# Patient Record
Sex: Female | Born: 1955 | Race: White | Hispanic: No | Marital: Married | Smoking: Former smoker
Health system: Southern US, Community
[De-identification: ages and names within clinical notes are randomized; demographics above are authoritative.]

## PROBLEM LIST (undated history)

## (undated) HISTORY — PX: CHOLECYSTECTOMY: SHX55

---

## 1998-06-19 ENCOUNTER — Ambulatory Visit: Admission: RE | Admit: 1998-06-19 | Discharge: 1998-06-19 | Payer: Self-pay | Admitting: Obstetrics and Gynecology

## 1998-06-19 ENCOUNTER — Encounter: Payer: Self-pay | Admitting: Family Medicine

## 1998-06-23 HISTORY — PX: BREAST EXCISIONAL BIOPSY: SUR124

## 1999-07-08 ENCOUNTER — Encounter: Admission: RE | Admit: 1999-07-08 | Discharge: 1999-07-08 | Payer: Self-pay | Admitting: Family Medicine

## 1999-07-08 ENCOUNTER — Encounter: Payer: Self-pay | Admitting: Family Medicine

## 1999-07-18 ENCOUNTER — Encounter: Admission: RE | Admit: 1999-07-18 | Discharge: 1999-07-18 | Payer: Self-pay | Admitting: Family Medicine

## 1999-07-18 ENCOUNTER — Encounter: Payer: Self-pay | Admitting: Family Medicine

## 1999-08-01 ENCOUNTER — Ambulatory Visit (HOSPITAL_BASED_OUTPATIENT_CLINIC_OR_DEPARTMENT_OTHER): Admission: RE | Admit: 1999-08-01 | Discharge: 1999-08-01 | Payer: Self-pay | Admitting: Surgery

## 1999-08-01 ENCOUNTER — Encounter (INDEPENDENT_AMBULATORY_CARE_PROVIDER_SITE_OTHER): Payer: Self-pay | Admitting: Specialist

## 1999-08-14 ENCOUNTER — Encounter: Admission: RE | Admit: 1999-08-14 | Discharge: 1999-11-12 | Payer: Self-pay | Admitting: Radiation Oncology

## 2000-07-20 ENCOUNTER — Encounter: Payer: Self-pay | Admitting: Family Medicine

## 2000-07-20 ENCOUNTER — Encounter: Admission: RE | Admit: 2000-07-20 | Discharge: 2000-07-20 | Payer: Self-pay | Admitting: Family Medicine

## 2000-09-10 ENCOUNTER — Ambulatory Visit (HOSPITAL_COMMUNITY): Admission: RE | Admit: 2000-09-10 | Discharge: 2000-09-10 | Payer: Self-pay | Admitting: Hematology & Oncology

## 2000-09-10 ENCOUNTER — Encounter: Payer: Self-pay | Admitting: Hematology & Oncology

## 2000-10-14 ENCOUNTER — Encounter: Payer: Self-pay | Admitting: Hematology & Oncology

## 2000-10-14 ENCOUNTER — Ambulatory Visit (HOSPITAL_COMMUNITY): Admission: RE | Admit: 2000-10-14 | Discharge: 2000-10-14 | Payer: Self-pay | Admitting: Hematology & Oncology

## 2001-06-30 ENCOUNTER — Encounter: Payer: Self-pay | Admitting: Family Medicine

## 2001-06-30 ENCOUNTER — Encounter: Admission: RE | Admit: 2001-06-30 | Discharge: 2001-06-30 | Payer: Self-pay | Admitting: Family Medicine

## 2001-07-22 ENCOUNTER — Encounter: Payer: Self-pay | Admitting: Family Medicine

## 2001-07-22 ENCOUNTER — Encounter: Admission: RE | Admit: 2001-07-22 | Discharge: 2001-07-22 | Payer: Self-pay | Admitting: Family Medicine

## 2002-07-27 ENCOUNTER — Encounter: Admission: RE | Admit: 2002-07-27 | Discharge: 2002-07-27 | Payer: Self-pay | Admitting: Family Medicine

## 2002-07-27 ENCOUNTER — Encounter: Payer: Self-pay | Admitting: Family Medicine

## 2003-08-09 ENCOUNTER — Encounter: Admission: RE | Admit: 2003-08-09 | Discharge: 2003-08-09 | Payer: Self-pay | Admitting: Family Medicine

## 2004-05-22 ENCOUNTER — Ambulatory Visit: Payer: Self-pay | Admitting: Internal Medicine

## 2004-05-22 ENCOUNTER — Ambulatory Visit: Payer: Self-pay

## 2004-05-31 ENCOUNTER — Ambulatory Visit: Payer: Self-pay | Admitting: Internal Medicine

## 2004-07-24 ENCOUNTER — Ambulatory Visit: Payer: Self-pay | Admitting: Hematology & Oncology

## 2004-08-14 ENCOUNTER — Encounter: Admission: RE | Admit: 2004-08-14 | Discharge: 2004-08-14 | Payer: Self-pay | Admitting: Hematology & Oncology

## 2005-06-04 ENCOUNTER — Other Ambulatory Visit: Admission: RE | Admit: 2005-06-04 | Discharge: 2005-06-04 | Payer: Self-pay | Admitting: Family Medicine

## 2005-06-11 ENCOUNTER — Ambulatory Visit: Payer: Self-pay | Admitting: Internal Medicine

## 2005-07-23 ENCOUNTER — Ambulatory Visit: Payer: Self-pay | Admitting: Hematology & Oncology

## 2005-08-15 ENCOUNTER — Encounter: Admission: RE | Admit: 2005-08-15 | Discharge: 2005-08-15 | Payer: Self-pay | Admitting: Hematology & Oncology

## 2006-08-11 ENCOUNTER — Ambulatory Visit: Payer: Self-pay | Admitting: Hematology & Oncology

## 2006-08-14 LAB — CBC WITH DIFFERENTIAL/PLATELET
EOS%: 1.7 % (ref 0.0–7.0)
LYMPH%: 33.6 % (ref 14.0–48.0)
MCH: 28.2 pg (ref 26.0–34.0)
MCHC: 34.3 g/dL (ref 32.0–36.0)
NEUT#: 3.8 10*3/uL (ref 1.5–6.5)
Platelets: 305 10*3/uL (ref 145–400)
RDW: 11.7 % (ref 11.3–14.5)
lymph#: 2.3 10*3/uL (ref 0.9–3.3)

## 2006-08-14 LAB — COMPREHENSIVE METABOLIC PANEL
ALT: 23 U/L (ref 0–35)
AST: 21 U/L (ref 0–37)
Alkaline Phosphatase: 32 U/L — ABNORMAL LOW (ref 39–117)
BUN: 16 mg/dL (ref 6–23)
Calcium: 9.3 mg/dL (ref 8.4–10.5)
Creatinine, Ser: 0.77 mg/dL (ref 0.40–1.20)
Total Bilirubin: 0.6 mg/dL (ref 0.3–1.2)
Total Protein: 6.7 g/dL (ref 6.0–8.3)

## 2006-08-18 ENCOUNTER — Encounter: Admission: RE | Admit: 2006-08-18 | Discharge: 2006-08-18 | Payer: Self-pay | Admitting: Hematology & Oncology

## 2007-08-24 ENCOUNTER — Encounter: Admission: RE | Admit: 2007-08-24 | Discharge: 2007-08-24 | Payer: Self-pay | Admitting: Hematology & Oncology

## 2007-11-18 ENCOUNTER — Other Ambulatory Visit: Admission: RE | Admit: 2007-11-18 | Discharge: 2007-11-18 | Payer: Self-pay | Admitting: Family Medicine

## 2008-06-20 ENCOUNTER — Ambulatory Visit: Payer: Self-pay | Admitting: Hematology & Oncology

## 2008-06-26 ENCOUNTER — Encounter: Admission: RE | Admit: 2008-06-26 | Discharge: 2008-06-26 | Payer: Self-pay | Admitting: Hematology & Oncology

## 2008-06-30 ENCOUNTER — Encounter: Admission: RE | Admit: 2008-06-30 | Discharge: 2008-06-30 | Payer: Self-pay | Admitting: Hematology & Oncology

## 2009-06-19 ENCOUNTER — Ambulatory Visit: Payer: Self-pay | Admitting: Hematology & Oncology

## 2009-06-20 LAB — COMPREHENSIVE METABOLIC PANEL
AST: 20 U/L (ref 0–37)
Alkaline Phosphatase: 46 U/L (ref 39–117)
BUN: 16 mg/dL (ref 6–23)
CO2: 27 mEq/L (ref 19–32)
Calcium: 8.9 mg/dL (ref 8.4–10.5)
Creatinine, Ser: 0.75 mg/dL (ref 0.40–1.20)
Glucose, Bld: 108 mg/dL — ABNORMAL HIGH (ref 70–99)
Potassium: 3.9 mEq/L (ref 3.5–5.3)
Sodium: 140 mEq/L (ref 135–145)
Total Bilirubin: 0.4 mg/dL (ref 0.3–1.2)

## 2009-06-20 LAB — CBC WITH DIFFERENTIAL (CANCER CENTER ONLY)
BASO%: 0.4 % (ref 0.0–2.0)
EOS%: 2.2 % (ref 0.0–7.0)
Eosinophils Absolute: 0.1 10*3/uL (ref 0.0–0.5)
LYMPH%: 33.1 % (ref 14.0–48.0)
MCH: 30.3 pg (ref 26.0–34.0)
MCHC: 35 g/dL (ref 32.0–36.0)
MCV: 87 fL (ref 81–101)
NEUT#: 3.1 10*3/uL (ref 1.5–6.5)
NEUT%: 58.5 % (ref 39.6–80.0)

## 2009-07-06 ENCOUNTER — Encounter: Admission: RE | Admit: 2009-07-06 | Discharge: 2009-07-06 | Payer: Self-pay | Admitting: Hematology & Oncology

## 2010-06-25 ENCOUNTER — Ambulatory Visit: Payer: Self-pay | Admitting: Hematology & Oncology

## 2010-06-26 LAB — COMPREHENSIVE METABOLIC PANEL
ALT: 23 U/L (ref 0–35)
AST: 22 U/L (ref 0–37)
Albumin: 3.8 g/dL (ref 3.5–5.2)
Alkaline Phosphatase: 42 U/L (ref 39–117)
BUN: 15 mg/dL (ref 6–23)
CO2: 28 mEq/L (ref 19–32)
Calcium: 9 mg/dL (ref 8.4–10.5)
Chloride: 100 mEq/L (ref 96–112)
Creatinine, Ser: 0.71 mg/dL (ref 0.40–1.20)
Glucose, Bld: 88 mg/dL (ref 70–99)
Potassium: 4 mEq/L (ref 3.5–5.3)
Sodium: 137 mEq/L (ref 135–145)
Total Bilirubin: 0.6 mg/dL (ref 0.3–1.2)
Total Protein: 6.6 g/dL (ref 6.0–8.3)

## 2010-06-26 LAB — CBC WITH DIFFERENTIAL (CANCER CENTER ONLY)
BASO#: 0 10*3/uL (ref 0.0–0.2)
BASO%: 0.4 % (ref 0.0–2.0)
EOS%: 2.1 % (ref 0.0–7.0)
Eosinophils Absolute: 0.1 10*3/uL (ref 0.0–0.5)
HCT: 39.2 % (ref 34.8–46.6)
HGB: 13.3 g/dL (ref 11.6–15.9)
LYMPH#: 1.7 10*3/uL (ref 0.9–3.3)
LYMPH%: 30.5 % (ref 14.0–48.0)
MCH: 30.5 pg (ref 26.0–34.0)
MCHC: 33.9 g/dL (ref 32.0–36.0)
MCV: 90 fL (ref 81–101)
MONO#: 0.4 10*3/uL (ref 0.1–0.9)
MONO%: 7.3 % (ref 0.0–13.0)
NEUT#: 3.4 10*3/uL (ref 1.5–6.5)
NEUT%: 59.7 % (ref 39.6–80.0)
Platelets: 263 10*3/uL (ref 145–400)
RBC: 4.36 10*6/uL (ref 3.70–5.32)
RDW: 11.2 % (ref 10.5–14.6)
WBC: 5.6 10*3/uL (ref 3.9–10.0)

## 2010-07-11 ENCOUNTER — Other Ambulatory Visit: Payer: Self-pay | Admitting: Hematology & Oncology

## 2010-07-11 DIAGNOSIS — Z0000 Encounter for general adult medical examination without abnormal findings: Secondary | ICD-10-CM

## 2010-07-29 ENCOUNTER — Other Ambulatory Visit: Payer: Self-pay | Admitting: Family Medicine

## 2010-07-29 ENCOUNTER — Other Ambulatory Visit (HOSPITAL_COMMUNITY)
Admission: RE | Admit: 2010-07-29 | Discharge: 2010-07-29 | Disposition: A | Discharge status: Home / Self Care | Payer: BC Managed Care – PPO | Source: Ambulatory Visit | Attending: Family Medicine | Admitting: Family Medicine

## 2010-07-29 DIAGNOSIS — Z124 Encounter for screening for malignant neoplasm of cervix: Secondary | ICD-10-CM | POA: Insufficient documentation

## 2010-07-31 ENCOUNTER — Ambulatory Visit (HOSPITAL_BASED_OUTPATIENT_CLINIC_OR_DEPARTMENT_OTHER): Payer: Self-pay

## 2010-07-31 ENCOUNTER — Ambulatory Visit (HOSPITAL_BASED_OUTPATIENT_CLINIC_OR_DEPARTMENT_OTHER)
Admission: RE | Admit: 2010-07-31 | Discharge: 2010-07-31 | Disposition: A | Discharge status: Home / Self Care | Payer: BC Managed Care – PPO | Source: Ambulatory Visit | Attending: Hematology & Oncology | Admitting: Hematology & Oncology

## 2010-07-31 DIAGNOSIS — Z0000 Encounter for general adult medical examination without abnormal findings: Secondary | ICD-10-CM

## 2010-07-31 DIAGNOSIS — Z1231 Encounter for screening mammogram for malignant neoplasm of breast: Secondary | ICD-10-CM | POA: Insufficient documentation

## 2011-05-26 ENCOUNTER — Telehealth: Payer: Self-pay | Admitting: *Deleted

## 2011-05-26 NOTE — Telephone Encounter (Signed)
Left message with husband changed time of 1-4 appointment

## 2011-06-25 ENCOUNTER — Other Ambulatory Visit: Payer: Self-pay | Admitting: Hematology & Oncology

## 2011-06-25 DIAGNOSIS — Z1231 Encounter for screening mammogram for malignant neoplasm of breast: Secondary | ICD-10-CM

## 2011-06-27 ENCOUNTER — Ambulatory Visit (HOSPITAL_BASED_OUTPATIENT_CLINIC_OR_DEPARTMENT_OTHER): Payer: BC Managed Care – PPO | Admitting: Hematology & Oncology

## 2011-06-27 VITALS — BP 124/82 | HR 83 | Temp 96.3°F | Ht 63.5 in | Wt 149.0 lb

## 2011-06-27 DIAGNOSIS — D0510 Intraductal carcinoma in situ of unspecified breast: Secondary | ICD-10-CM

## 2011-06-27 DIAGNOSIS — Z853 Personal history of malignant neoplasm of breast: Secondary | ICD-10-CM

## 2011-06-27 NOTE — Progress Notes (Signed)
This office note has been dictated.

## 2011-06-30 NOTE — Progress Notes (Signed)
CC:   Magnus Sinning) Tenny Craw, M.D.  DIAGNOSIS:  Ductal carcinoma in situ of the left breast.  CURRENT THERAPY:  Observation.  INTERIM HISTORY:  Ms. Uphoff comes in for followup.  We see her once a year.  She is doing well.  She has had a good year last year.  She went to [place] with her husband on a business trip.  She really had a good time over there.  She has 2 grandkids now.  She really enjoys them.  She is really big into animals and is really enjoying the dogs that she has.  She has not had any problems with her health.  There is no problem with hot flashes or sweats.  She has had no fevers, sweats or chills.  There has been no problem with bowels or bladder.  Her last mammogram was actually January of last year.  Everything looked fine with the mammogram.  She is set up for one this month.  She has had no rashes.  She has had no cough or shortness breath.  PHYSICAL EXAMINATION:  This is a well-developed, well-nourished white female in no obvious distress.  Vital signs:  temperature 96.3, pulse 83, respiratory rate 18, blood pressure 124/82.  Weight is 149.  Head and neck:  Shows a normocephalic, atraumatic skull.  There are no ocular or oral lesions.  There are no palpable cervical or supraclavicular lymph nodes.  Lungs:  clear bilaterally.  Cardiac:  regular rate and rhythm with a normal S1 and S2.  There are no murmurs, rubs or bruits. abdomen:  Soft with good bowel sounds.  There is no palpable abdominal mass.  There is no fluid wave.  No palpable hepatosplenomegaly. Breasts:  right breast with no masses, edema or erythema.  There is no right axillary adenopathy.  Left breast shows well-healed lumpectomy at the 9 o'clock position.  There is some slight contraction of the left breast.  There is some slight left nipple eversion.  She has no left axillary adenopathy.  Back:  No tenderness over the spine, ribs, or hips.  There is no osteoporosis kyphosis.  Extremities:  no  clubbing, cyanosis or edema.  Skin:  no rashes, ecchymosis or petechia.  LABORATORY STUDIES:  Not done this visit.  IMPRESSION:  Ms. Sponsel left is a 56 year old white female with a history ductal carcinoma situ of the left breast.  this was back in 2001.  She is doing very well.  I do not see any issues with respect to recurrence.  we will plan to see her in 1 more year.  I did tell her to take 1 baby aspirin a day.  I really believe this is going to be helpful with respect to cardiovascular maintenance and also to some degree, prevention of cancer.    ______________________________ Josph Macho, M.D. PRE/MEDQ  D:  06/27/2011  T:  06/27/2011  Job:  [id number]

## 2011-08-04 ENCOUNTER — Ambulatory Visit (HOSPITAL_BASED_OUTPATIENT_CLINIC_OR_DEPARTMENT_OTHER)
Admission: RE | Admit: 2011-08-04 | Discharge: 2011-08-04 | Disposition: A | Discharge status: Home / Self Care | Payer: BC Managed Care – PPO | Source: Ambulatory Visit | Attending: Hematology & Oncology | Admitting: Hematology & Oncology

## 2011-08-04 DIAGNOSIS — Z1231 Encounter for screening mammogram for malignant neoplasm of breast: Secondary | ICD-10-CM | POA: Insufficient documentation

## 2012-06-25 ENCOUNTER — Other Ambulatory Visit (HOSPITAL_BASED_OUTPATIENT_CLINIC_OR_DEPARTMENT_OTHER): Payer: BC Managed Care – PPO | Admitting: Lab

## 2012-06-25 ENCOUNTER — Ambulatory Visit (HOSPITAL_BASED_OUTPATIENT_CLINIC_OR_DEPARTMENT_OTHER): Payer: BC Managed Care – PPO | Admitting: Hematology & Oncology

## 2012-06-25 VITALS — BP 118/77 | HR 74 | Temp 97.8°F | Resp 16 | Ht 63.0 in | Wt 153.0 lb

## 2012-06-25 DIAGNOSIS — D0510 Intraductal carcinoma in situ of unspecified breast: Secondary | ICD-10-CM

## 2012-06-25 DIAGNOSIS — Z87898 Personal history of other specified conditions: Secondary | ICD-10-CM

## 2012-06-25 DIAGNOSIS — M810 Age-related osteoporosis without current pathological fracture: Secondary | ICD-10-CM

## 2012-06-25 LAB — CBC WITH DIFFERENTIAL (CANCER CENTER ONLY)
BASO%: 0.3 % (ref 0.0–2.0)
EOS%: 2 % (ref 0.0–7.0)
HCT: 37.6 % (ref 34.8–46.6)
LYMPH#: 1.7 10*3/uL (ref 0.9–3.3)
LYMPH%: 28.8 % (ref 14.0–48.0)
MCHC: 33.8 g/dL (ref 32.0–36.0)
MONO%: 8.6 % (ref 0.0–13.0)
RBC: 4.26 10*6/uL (ref 3.70–5.32)
WBC: 6 10*3/uL (ref 3.9–10.0)

## 2012-06-25 NOTE — Progress Notes (Signed)
This office note has been dictated.

## 2012-06-26 LAB — COMPREHENSIVE METABOLIC PANEL
AST: 26 U/L (ref 0–37)
Albumin: 3.5 g/dL (ref 3.5–5.2)
Alkaline Phosphatase: 42 U/L (ref 39–117)
BUN: 12 mg/dL (ref 6–23)
Calcium: 9.2 mg/dL (ref 8.4–10.5)
Chloride: 102 mEq/L (ref 96–112)
Sodium: 137 mEq/L (ref 135–145)
Total Protein: 6.4 g/dL (ref 6.0–8.3)

## 2012-06-26 NOTE — Progress Notes (Signed)
CC:   Magnus Sinning) Tenny Craw, M.D.  DIAGNOSIS:  Ductal carcinoma in situ of the left breast.  CURRENT THERAPY:  Observation.  INTERVAL HISTORY:  Ms. Spielberg comes in for her yearly followup.  She had a good time last year.  She really had not happen health-wise to her.  Her family has been doing well.  They took a family trip down to [place] to [place] in October.  She had a good time down there.  She has had no problems with bony pain.  She is on vitamin D.  There has been no change in bowel or bladder habits.  She has had no fevers, sweats or chills.  She has had no headache.  There have been no swallowing difficulties.  Her last mammogram was done back in February of 2013.  The mammogram looked fine with no evidence of malignancy.  PHYSICAL EXAMINATION:  This is a well-developed, well-nourished white female in no obvious distress.  Vital signs:  Temperature of 97.8. Pulse is 74, respiratory rate 16, blood pressure 118/77.  Weight is 153. Head/neck:  Normocephalic, atraumatic skull.  There are no ocular or oral lesions.  There are no palpable cervical or supraclavicular lymph nodes.  Lungs:  Clear bilaterally.  Cardiac:  Regular rate and rhythm with a normal S1, S2.  There are no murmurs, rubs or bruits.  Breasts: Right breast:  No masses, edema or erythema.  There is no right axillary adenopathy.  Left breast shows a well-healed lumpectomy at the 9 o'clock position.  There was no distinct mass in the left breast.  There was no left axillary adenopathy.  Abdomen:  Soft with good bowel sounds.  There is no palpable abdominal mass.  There is no fluid wave.  There is no palpable hepatosplenomegaly.  Back:  No tenderness over the spine, ribs, or hips.  There is no kyphosis or osteoporotic changes.  Extremities: No clubbing, cyanosis or edema.  LABORATORY STUDIES:  White cell count is 6, hemoglobin 12.7, hematocrit 37.6, platelet count 245.  IMPRESSION:  Ms. Ohmer is a  57 year old white female with a history of ductal carcinoma in situ of the left breast.  She was diagnosed back in 2001.  Everything has been doing well with her.  She has had no evidence of recurrence.  We will go ahead and plan to continue to see her back yearly.    ______________________________ Josph Macho, M.D. PRE/MEDQ  D:  06/25/2012  T:  06/26/2012  Job:  [id number]

## 2012-06-28 ENCOUNTER — Telehealth: Payer: Self-pay | Admitting: *Deleted

## 2012-06-28 NOTE — Telephone Encounter (Signed)
Called patient to let her know that her labs and vitamin d levels are ok per dr. Myna Hidalgo

## 2012-06-28 NOTE — Telephone Encounter (Signed)
Message copied by Anselm Jungling on Mon Jun 28, 2012 10:29 AM ------      Message from: Josph Macho      Created: Sat Jun 26, 2012 12:09 PM       Call -labs and vit D are great!!!Cindee Lame

## 2012-07-05 ENCOUNTER — Other Ambulatory Visit: Payer: Self-pay | Admitting: Hematology & Oncology

## 2012-07-05 DIAGNOSIS — Z1239 Encounter for other screening for malignant neoplasm of breast: Secondary | ICD-10-CM

## 2012-08-04 ENCOUNTER — Ambulatory Visit (HOSPITAL_BASED_OUTPATIENT_CLINIC_OR_DEPARTMENT_OTHER)
Admission: RE | Admit: 2012-08-04 | Discharge: 2012-08-04 | Disposition: A | Discharge status: Home / Self Care | Payer: BC Managed Care – PPO | Source: Ambulatory Visit | Attending: Hematology & Oncology | Admitting: Hematology & Oncology

## 2012-08-04 DIAGNOSIS — Z1231 Encounter for screening mammogram for malignant neoplasm of breast: Secondary | ICD-10-CM | POA: Insufficient documentation

## 2012-08-04 DIAGNOSIS — Z1239 Encounter for other screening for malignant neoplasm of breast: Secondary | ICD-10-CM

## 2013-06-30 ENCOUNTER — Other Ambulatory Visit: Payer: Self-pay | Admitting: Hematology & Oncology

## 2013-06-30 DIAGNOSIS — Z1231 Encounter for screening mammogram for malignant neoplasm of breast: Secondary | ICD-10-CM

## 2013-07-07 ENCOUNTER — Other Ambulatory Visit: Payer: Self-pay | Admitting: Nurse Practitioner

## 2013-07-07 DIAGNOSIS — D0512 Intraductal carcinoma in situ of left breast: Secondary | ICD-10-CM | POA: Insufficient documentation

## 2013-07-08 ENCOUNTER — Ambulatory Visit (HOSPITAL_BASED_OUTPATIENT_CLINIC_OR_DEPARTMENT_OTHER): Payer: BC Managed Care – PPO | Admitting: Hematology & Oncology

## 2013-07-08 ENCOUNTER — Other Ambulatory Visit (HOSPITAL_BASED_OUTPATIENT_CLINIC_OR_DEPARTMENT_OTHER): Payer: BC Managed Care – PPO | Admitting: Lab

## 2013-07-08 ENCOUNTER — Encounter: Payer: Self-pay | Admitting: Hematology & Oncology

## 2013-07-08 VITALS — BP 112/66 | HR 90 | Temp 98.0°F | Resp 14 | Ht 64.0 in | Wt 158.0 lb

## 2013-07-08 DIAGNOSIS — D0512 Intraductal carcinoma in situ of left breast: Secondary | ICD-10-CM

## 2013-07-08 DIAGNOSIS — M818 Other osteoporosis without current pathological fracture: Secondary | ICD-10-CM

## 2013-07-08 DIAGNOSIS — Z853 Personal history of malignant neoplasm of breast: Secondary | ICD-10-CM

## 2013-07-08 DIAGNOSIS — T386X5A Adverse effect of antigonadotrophins, antiestrogens, antiandrogens, not elsewhere classified, initial encounter: Secondary | ICD-10-CM

## 2013-07-08 DIAGNOSIS — D0510 Intraductal carcinoma in situ of unspecified breast: Secondary | ICD-10-CM

## 2013-07-08 LAB — CBC WITH DIFFERENTIAL (CANCER CENTER ONLY)
BASO#: 0 10*3/uL (ref 0.0–0.2)
BASO%: 0.3 % (ref 0.0–2.0)
EOS ABS: 0.1 10*3/uL (ref 0.0–0.5)
EOS%: 1.9 % (ref 0.0–7.0)
HCT: 37.9 % (ref 34.8–46.6)
HEMOGLOBIN: 12.7 g/dL (ref 11.6–15.9)
LYMPH#: 1.8 10*3/uL (ref 0.9–3.3)
LYMPH%: 25 % (ref 14.0–48.0)
MCH: 29.3 pg (ref 26.0–34.0)
MCHC: 33.5 g/dL (ref 32.0–36.0)
MCV: 87 fL (ref 81–101)
MONO#: 0.6 10*3/uL (ref 0.1–0.9)
MONO%: 7.8 % (ref 0.0–13.0)
NEUT%: 65 % (ref 39.6–80.0)
NEUTROS ABS: 4.7 10*3/uL (ref 1.5–6.5)
Platelets: 264 10*3/uL (ref 145–400)
RBC: 4.34 10*6/uL (ref 3.70–5.32)
RDW: 12.4 % (ref 11.1–15.7)
WBC: 7.2 10*3/uL (ref 3.9–10.0)

## 2013-07-08 NOTE — Progress Notes (Signed)
This office note has been dictated.

## 2013-07-09 LAB — COMPREHENSIVE METABOLIC PANEL
ALBUMIN: 3.5 g/dL (ref 3.5–5.2)
ALK PHOS: 47 U/L (ref 39–117)
ALT: 19 U/L (ref 0–35)
AST: 20 U/L (ref 0–37)
BUN: 16 mg/dL (ref 6–23)
CO2: 26 meq/L (ref 19–32)
Calcium: 9.5 mg/dL (ref 8.4–10.5)
Chloride: 102 mEq/L (ref 96–112)
Creatinine, Ser: 0.79 mg/dL (ref 0.50–1.10)
GLUCOSE: 92 mg/dL (ref 70–99)
POTASSIUM: 3.9 meq/L (ref 3.5–5.3)
SODIUM: 140 meq/L (ref 135–145)
TOTAL PROTEIN: 6.3 g/dL (ref 6.0–8.3)
Total Bilirubin: 0.5 mg/dL (ref 0.3–1.2)

## 2013-07-09 LAB — VITAMIN D 25 HYDROXY (VIT D DEFICIENCY, FRACTURES): VIT D 25 HYDROXY: 83 ng/mL (ref 30–89)

## 2013-07-09 NOTE — Progress Notes (Signed)
CC:   Kristine Stewart, M.D.  DIAGNOSIS:  Ductal carcinoma in situ of the left breast.  CURRENT THERAPY:  Observation.  INTERIM HISTORY:  Kristine Stewart comes in for a followup.  She is doing quite well.  We see her yearly.  She last came in once in a year, so that we can follow her up.  She has had no problems with respect to her thyroid.  She has a history of hyperthyroidism.  She is on Cytomel for this.  She did have some back issues.  She says that she has a bulging disk and cause her some issues.  She has had no problems with fever, sweats, or chills.  She has had no cough or shortness of breath.  There has been no change in bowel or bladder habits.  She is due for a mammogram next month.  PHYSICAL EXAMINATION:  General:  This is a well-developed, well- nourished white female in no obvious distress.  Vital Signs: Temperature of 98.6, pulse 90, respiratory rate 14, blood pressure 112/66, weight is 158 pounds.  Head and Neck:  Normocephalic, atraumatic skull.  There are no ocular or oral lesions.  There are no palpable cervical or supraclavicular lymph nodes.  Lungs:  Clear bilaterally. Cardiac:  Regular rate and rhythm with a normal S1, S2.  There are no murmurs, rubs, or bruits.  Breasts:  Shows right breast with no masses, edema, or erythema.  There is no right axillary adenopathy.  Left breast shows well-healed lumpectomy at the 9 o'clock position.  There is some slight contraction of the left breast.  There is no mass in the left breast.  There is no left axillary adenopathy.  Abdomen:  Soft.  She has good bowel sounds.  There is no fluid wave.  There is no palpable hepatosplenomegaly.  Extremities:  Show no clubbing, cyanosis, or edema. Skin:  No rashes, ecchymoses, or petechia.  Back:  No kyphosis or osteoporotic changes.  LABORATORY STUDIES:  White cell count 7.2, hemoglobin 12.7, hematocrit 37.9, platelet count 264.  IMPRESSION:  Kristine Stewart is a very charming  58 year old white female. She has history of ductal carcinoma in situ of the left breast.  She underwent a lumpectomy.  She had a lumpectomy back in 2001.  She did have radiation therapy.  We will go ahead and plan to get her back in 1 more year.  I do not see any evidence of issues.  There is no evidence of recurrence.    ______________________________ Kristine Stewart, M.D. PRE/MEDQ  D:  07/08/2013  T:  07/09/2013  Job:  [id number]

## 2013-07-12 ENCOUNTER — Telehealth: Payer: Self-pay | Admitting: *Deleted

## 2013-07-12 NOTE — Telephone Encounter (Addendum)
Message copied by Orlando Penner on Tue Jul 12, 2013  3:48 PM ------      Message from: [name]      Created: Sun Jul 10, 2013  8:07 PM       Call - labs and Vit D are good!!  Laurey Arrow ------This message left on pt's mobil phone

## 2013-08-08 ENCOUNTER — Ambulatory Visit (HOSPITAL_BASED_OUTPATIENT_CLINIC_OR_DEPARTMENT_OTHER)
Admission: RE | Admit: 2013-08-08 | Discharge: 2013-08-08 | Disposition: A | Discharge status: Home / Self Care | Payer: BC Managed Care – PPO | Source: Ambulatory Visit | Attending: Hematology & Oncology | Admitting: Hematology & Oncology

## 2013-08-08 DIAGNOSIS — Z1231 Encounter for screening mammogram for malignant neoplasm of breast: Secondary | ICD-10-CM | POA: Insufficient documentation

## 2013-08-08 DIAGNOSIS — Z803 Family history of malignant neoplasm of breast: Secondary | ICD-10-CM | POA: Insufficient documentation

## 2014-07-07 ENCOUNTER — Other Ambulatory Visit: Payer: Self-pay | Admitting: Hematology & Oncology

## 2014-07-07 ENCOUNTER — Encounter: Payer: Self-pay | Admitting: Hematology & Oncology

## 2014-07-07 ENCOUNTER — Other Ambulatory Visit (HOSPITAL_BASED_OUTPATIENT_CLINIC_OR_DEPARTMENT_OTHER): Payer: BLUE CROSS/BLUE SHIELD | Admitting: Lab

## 2014-07-07 ENCOUNTER — Ambulatory Visit (HOSPITAL_BASED_OUTPATIENT_CLINIC_OR_DEPARTMENT_OTHER): Payer: BLUE CROSS/BLUE SHIELD | Admitting: Hematology & Oncology

## 2014-07-07 VITALS — BP 120/68 | HR 72 | Temp 98.1°F | Resp 14 | Ht 64.0 in | Wt 143.0 lb

## 2014-07-07 DIAGNOSIS — Z853 Personal history of malignant neoplasm of breast: Secondary | ICD-10-CM

## 2014-07-07 DIAGNOSIS — D0512 Intraductal carcinoma in situ of left breast: Secondary | ICD-10-CM

## 2014-07-07 DIAGNOSIS — Z1231 Encounter for screening mammogram for malignant neoplasm of breast: Secondary | ICD-10-CM

## 2014-07-07 DIAGNOSIS — T386X5A Adverse effect of antigonadotrophins, antiestrogens, antiandrogens, not elsewhere classified, initial encounter: Secondary | ICD-10-CM

## 2014-07-07 DIAGNOSIS — M818 Other osteoporosis without current pathological fracture: Secondary | ICD-10-CM

## 2014-07-07 DIAGNOSIS — D0510 Intraductal carcinoma in situ of unspecified breast: Secondary | ICD-10-CM

## 2014-07-07 LAB — CBC WITH DIFFERENTIAL (CANCER CENTER ONLY)
BASO#: 0 10*3/uL (ref 0.0–0.2)
BASO%: 0.6 % (ref 0.0–2.0)
EOS%: 3 % (ref 0.0–7.0)
Eosinophils Absolute: 0.2 10*3/uL (ref 0.0–0.5)
HEMATOCRIT: 37.9 % (ref 34.8–46.6)
HGB: 12.6 g/dL (ref 11.6–15.9)
LYMPH#: 1.8 10*3/uL (ref 0.9–3.3)
LYMPH%: 33.3 % (ref 14.0–48.0)
MCH: 30.4 pg (ref 26.0–34.0)
MCHC: 33.2 g/dL (ref 32.0–36.0)
MCV: 91 fL (ref 81–101)
MONO#: 0.4 10*3/uL (ref 0.1–0.9)
MONO%: 7 % (ref 0.0–13.0)
NEUT#: 3 10*3/uL (ref 1.5–6.5)
NEUT%: 56.1 % (ref 39.6–80.0)
PLATELETS: 241 10*3/uL (ref 145–400)
RBC: 4.15 10*6/uL (ref 3.70–5.32)
RDW: 12.7 % (ref 11.1–15.7)
WBC: 5.3 10*3/uL (ref 3.9–10.0)

## 2014-07-07 LAB — COMPREHENSIVE METABOLIC PANEL
ALT: 19 U/L (ref 0–35)
AST: 22 U/L (ref 0–37)
Albumin: 3.5 g/dL (ref 3.5–5.2)
Alkaline Phosphatase: 43 U/L (ref 39–117)
BUN: 12 mg/dL (ref 6–23)
CALCIUM: 9.4 mg/dL (ref 8.4–10.5)
CHLORIDE: 100 meq/L (ref 96–112)
CO2: 28 meq/L (ref 19–32)
Creatinine, Ser: 0.76 mg/dL (ref 0.50–1.10)
Glucose, Bld: 71 mg/dL (ref 70–99)
Potassium: 3.7 mEq/L (ref 3.5–5.3)
SODIUM: 136 meq/L (ref 135–145)
TOTAL PROTEIN: 6.8 g/dL (ref 6.0–8.3)
Total Bilirubin: 0.5 mg/dL (ref 0.2–1.2)

## 2014-07-07 NOTE — Progress Notes (Signed)
Hematology and Oncology Follow Up Visit  Kristine Stewart [id number] 1956/02/08 59 y.o. 07/07/2014   Principle Diagnosis:  Ductal carcinoma in situ of the left breast.  Current Therapy:    Observation     Interim History:  Ms.  Kristine Stewart is back for her yearly follow-up. She is doing quite well. We see her once a year. She's due for mammogram in February.  she's taking her aspirin and vitamin D.    she's had no problem with nausea vomiting. She's lost some weight which she is happy about. There has been no change in bowel or bladder habits.  There's been no nausea. She's had no rashes. She's had no leg swelling.  There's been no change in medications. She is on medication for hypothyroidism.  Medications:  Current outpatient prescriptions:  .  aspirin 81 MG EC tablet, Take 81 mg by mouth. Swallow whole., Disp: , Rfl:  .  Calcium 150 MG TABS, Take by mouth every morning., Disp: , Rfl:  .  Cholecalciferol (VITAMIN D3) 2000 UNITS TABS, Take by mouth every morning., Disp: , Rfl:  .  Ibuprofen-Diphenhydramine Cit (ADVIL PM PO), Take by mouth at bedtime., Disp: , Rfl:  .  liothyronine (CYTOMEL) 25 MCG tablet, Take 25 mcg by mouth 2 (two) times daily. , Disp: , Rfl:  .  Mag Aspart-Potassium Aspart (POTASSIUM & MAGNESIUM ASPARTAT PO), Take 2 tablets by mouth at bedtime., Disp: , Rfl:  .  metoprolol succinate (TOPROL-XL) 100 MG 24 hr tablet, Take 100 mg by mouth daily. , Disp: , Rfl:  .  Multiple Vitamin (MULTIVITAMIN WITH MINERALS) TABS tablet, Take 1 tablet by mouth daily., Disp: , Rfl:  .  Riboflavin (VITAMIN B-2 PO), Take by mouth 2 (two) times daily., Disp: , Rfl:  .  simvastatin (ZOCOR) 40 MG tablet, Take 40 mg by mouth at bedtime. , Disp: , Rfl:   Allergies:  Allergies  Allergen Reactions  . Codeine Itching    Past Medical History, Surgical history, Social history, and Family History were reviewed and updated.  Review of Systems: As above  Physical Exam:  height is 5\' 4"   (1.626 m) and weight is 143 lb (64.864 kg). Her oral temperature is 98.1 F (36.7 C). Her blood pressure is 120/68 and her pulse is 72. Her respiration is 14.   Well-developed and well-nourished white female in no obvious distress. Head and neck exam shows no ocular or oral lesions. There are no palpable cervical or supraclavicular lymph nodes. Lungs are clear. Cardiac exam regular rate and rhythm with no murmurs, rubs or bruits. Abdomen is soft. She has good bowel sounds. There is no fluid wave. There is no palpable liver or spleen tip. Breast exam shows right breast no masses, edema or erythema. There is no right axillary adenopathy. Left breast shows slightly contracted breast. She has a well-healed lumpectomy at the 9:00 position. There is no axillary adenopathy. Extrema shows no clubbing, cyanosis or edema. Skin exam no rashes.  Lab Results  Component Value Date   WBC 5.3 07/07/2014   HGB 12.6 07/07/2014   HCT 37.9 07/07/2014   MCV 91 07/07/2014   PLT 241 07/07/2014     Chemistry      Component Value Date/Time   NA 140 07/08/2013 0904   K 3.9 07/08/2013 0904   CL 102 07/08/2013 0904   CO2 26 07/08/2013 0904   BUN 16 07/08/2013 0904   CREATININE 0.79 07/08/2013 0904      Component Value Date/Time  CALCIUM 9.5 07/08/2013 0904   ALKPHOS 47 07/08/2013 0904   AST 20 07/08/2013 0904   ALT 19 07/08/2013 0904   BILITOT 0.5 07/08/2013 0904         Impression and Plan: Kristine Stewart is 59 year old white female with history of DCIS of the left breast. This was back 14 years ago. She had radiation therapy.  She's doing quite well.  For now, we will just get her back in 1 more year.  I do not see a need for any type of scans.   [name], MD 1/15/201611:39 AM

## 2014-07-08 LAB — VITAMIN D 25 HYDROXY (VIT D DEFICIENCY, FRACTURES): VIT D 25 HYDROXY: 71 ng/mL (ref 30–100)

## 2014-07-10 ENCOUNTER — Telehealth: Payer: Self-pay | Admitting: *Deleted

## 2014-07-10 NOTE — Telephone Encounter (Signed)
-----   Message from [name], MD sent at 07/07/2014  4:02 PM EST ----- Please call and tell her that her labs look okay. Laurey Arrow

## 2014-07-11 ENCOUNTER — Telehealth: Payer: Self-pay | Admitting: *Deleted

## 2014-07-11 NOTE — Telephone Encounter (Signed)
-----   Message from [name], MD sent at 07/11/2014 11:28 AM EST ----- Call - vit D is ok!!!  Kristine Stewart

## 2014-08-11 ENCOUNTER — Ambulatory Visit (HOSPITAL_BASED_OUTPATIENT_CLINIC_OR_DEPARTMENT_OTHER)
Admission: RE | Admit: 2014-08-11 | Discharge: 2014-08-11 | Disposition: A | Discharge status: Home / Self Care | Payer: BLUE CROSS/BLUE SHIELD | Source: Ambulatory Visit | Attending: Hematology & Oncology | Admitting: Hematology & Oncology

## 2014-08-11 DIAGNOSIS — Z1231 Encounter for screening mammogram for malignant neoplasm of breast: Secondary | ICD-10-CM | POA: Diagnosis present

## 2014-12-26 ENCOUNTER — Telehealth: Payer: Self-pay | Admitting: *Deleted

## 2014-12-26 NOTE — Telephone Encounter (Signed)
Patient would like to know if she can take black cohosh for hot flashes. Spoke to Dr Marin Olp who is fine with patient taking this supplement. Patient aware.

## 2015-01-17 ENCOUNTER — Other Ambulatory Visit: Payer: Self-pay | Admitting: Family Medicine

## 2015-01-17 ENCOUNTER — Other Ambulatory Visit (HOSPITAL_COMMUNITY)
Admission: RE | Admit: 2015-01-17 | Discharge: 2015-01-17 | Disposition: A | Discharge status: Home / Self Care | Payer: BLUE CROSS/BLUE SHIELD | Source: Ambulatory Visit | Attending: Family Medicine | Admitting: Family Medicine

## 2015-01-17 DIAGNOSIS — Z124 Encounter for screening for malignant neoplasm of cervix: Secondary | ICD-10-CM | POA: Insufficient documentation

## 2015-01-19 LAB — CYTOLOGY - PAP

## 2015-07-06 ENCOUNTER — Other Ambulatory Visit: Payer: Self-pay | Admitting: Hematology & Oncology

## 2015-07-06 ENCOUNTER — Encounter: Payer: Self-pay | Admitting: Hematology & Oncology

## 2015-07-06 ENCOUNTER — Other Ambulatory Visit (HOSPITAL_BASED_OUTPATIENT_CLINIC_OR_DEPARTMENT_OTHER): Payer: BLUE CROSS/BLUE SHIELD

## 2015-07-06 ENCOUNTER — Ambulatory Visit (HOSPITAL_BASED_OUTPATIENT_CLINIC_OR_DEPARTMENT_OTHER): Payer: BLUE CROSS/BLUE SHIELD | Admitting: Hematology & Oncology

## 2015-07-06 VITALS — BP 121/78 | HR 77 | Temp 97.8°F | Resp 16 | Ht 64.0 in | Wt 154.0 lb

## 2015-07-06 DIAGNOSIS — Z86000 Personal history of in-situ neoplasm of breast: Secondary | ICD-10-CM

## 2015-07-06 DIAGNOSIS — D0512 Intraductal carcinoma in situ of left breast: Secondary | ICD-10-CM

## 2015-07-06 DIAGNOSIS — M810 Age-related osteoporosis without current pathological fracture: Secondary | ICD-10-CM

## 2015-07-06 DIAGNOSIS — Z1231 Encounter for screening mammogram for malignant neoplasm of breast: Secondary | ICD-10-CM

## 2015-07-06 LAB — CMP (CANCER CENTER ONLY)
ALBUMIN: 3 g/dL — AB (ref 3.3–5.5)
ALT(SGPT): 23 U/L (ref 10–47)
AST: 27 U/L (ref 11–38)
Alkaline Phosphatase: 42 U/L (ref 26–84)
BILIRUBIN TOTAL: 0.6 mg/dL (ref 0.20–1.60)
BUN: 13 mg/dL (ref 7–22)
CO2: 29 meq/L (ref 18–33)
CREATININE: 0.7 mg/dL (ref 0.6–1.2)
Calcium: 9.1 mg/dL (ref 8.0–10.3)
Chloride: 98 mEq/L (ref 98–108)
GLUCOSE: 108 mg/dL (ref 73–118)
Potassium: 3.8 mEq/L (ref 3.3–4.7)
SODIUM: 137 meq/L (ref 128–145)
Total Protein: 7.3 g/dL (ref 6.4–8.1)

## 2015-07-06 LAB — CBC WITH DIFFERENTIAL (CANCER CENTER ONLY)
BASO#: 0.1 10*3/uL (ref 0.0–0.2)
BASO%: 0.6 % (ref 0.0–2.0)
EOS ABS: 0.4 10*3/uL (ref 0.0–0.5)
EOS%: 4.9 % (ref 0.0–7.0)
HCT: 38.1 % (ref 34.8–46.6)
HEMOGLOBIN: 12.8 g/dL (ref 11.6–15.9)
LYMPH#: 2.4 10*3/uL (ref 0.9–3.3)
LYMPH%: 30.7 % (ref 14.0–48.0)
MCH: 30.3 pg (ref 26.0–34.0)
MCHC: 33.6 g/dL (ref 32.0–36.0)
MCV: 90 fL (ref 81–101)
MONO#: 0.6 10*3/uL (ref 0.1–0.9)
MONO%: 8.1 % (ref 0.0–13.0)
NEUT%: 55.7 % (ref 39.6–80.0)
NEUTROS ABS: 4.4 10*3/uL (ref 1.5–6.5)
Platelets: 297 10*3/uL (ref 145–400)
RBC: 4.22 10*6/uL (ref 3.70–5.32)
RDW: 12.3 % (ref 11.1–15.7)
WBC: 7.8 10*3/uL (ref 3.9–10.0)

## 2015-07-06 NOTE — Progress Notes (Signed)
Hematology and Oncology Follow Up Visit  Kristine Stewart [id number] March 04, 1956 60 y.o. 07/06/2015   Principle Diagnosis:  Ductal carcinoma in situ of the left breast.  Current Therapy:    Observation     Interim History:  Ms.  Stewart is back for her yearly follow-up. She is doing quite well. We see her once a year. She's due for mammogram in February.  she's taking her aspirin and vitamin D.    she's had no problem with nausea or vomiting. She's lost some weight which she is happy about. There has been no change in bowel or bladder habits.  There's been no nausea. She's had no rashes. She's had no leg swelling.  There's been no change in medications. She is on medication for hypothyroidism.  Medications:  Current outpatient prescriptions:  .  aspirin 81 MG EC tablet, Take 81 mg by mouth. Swallow whole., Disp: , Rfl:  .  Calcium 150 MG TABS, Take by mouth every morning., Disp: , Rfl:  .  Cholecalciferol (VITAMIN D3) 2000 UNITS TABS, Take by mouth every morning., Disp: , Rfl:  .  Ibuprofen-Diphenhydramine Cit (ADVIL PM PO), Take by mouth at bedtime., Disp: , Rfl:  .  liothyronine (CYTOMEL) 25 MCG tablet, Take 25 mcg by mouth 2 (two) times daily. , Disp: , Rfl:  .  Mag Aspart-Potassium Aspart (POTASSIUM & MAGNESIUM ASPARTAT PO), Take 2 tablets by mouth at bedtime., Disp: , Rfl:  .  metoprolol succinate (TOPROL-XL) 100 MG 24 hr tablet, Take 100 mg by mouth daily. , Disp: , Rfl:  .  Multiple Vitamin (MULTIVITAMIN WITH MINERALS) TABS tablet, Take 1 tablet by mouth daily., Disp: , Rfl:  .  Riboflavin (VITAMIN B-2 PO), Take by mouth 2 (two) times daily., Disp: , Rfl:   Allergies:  Allergies  Allergen Reactions  . Codeine Itching    Past Medical History, Surgical history, Social history, and Family History were reviewed and updated.  Review of Systems: As above  Physical Exam:  height is 5\' 4"  (1.626 m) and weight is 154 lb (69.854 kg). Her oral temperature is 97.8 F (36.6 C).  Her blood pressure is 121/78 and her pulse is 77. Her respiration is 16.   Well-developed and well-nourished white female in no obvious distress. Head and neck exam shows no ocular or oral lesions. There are no palpable cervical or supraclavicular lymph nodes. Lungs are clear. Cardiac exam regular rate and rhythm with no murmurs, rubs or bruits. Abdomen is soft. She has good bowel sounds. There is no fluid wave. There is no palpable liver or spleen tip. Breast exam shows right breast no masses, edema or erythema. There is no right axillary adenopathy. Left breast shows slightly contracted breast. She has a well-healed lumpectomy at the 9:00 position. There is no axillary adenopathy. Extrema shows no clubbing, cyanosis or edema. Skin exam no rashes.  Lab Results  Component Value Date   WBC 7.8 07/06/2015   HGB 12.8 07/06/2015   HCT 38.1 07/06/2015   MCV 90 07/06/2015   PLT 297 07/06/2015     Chemistry      Component Value Date/Time   NA 137 07/06/2015 1002   NA 136 07/07/2014 1038   K 3.8 07/06/2015 1002   K 3.7 07/07/2014 1038   CL 98 07/06/2015 1002   CL 100 07/07/2014 1038   CO2 29 07/06/2015 1002   CO2 28 07/07/2014 1038   BUN 13 07/06/2015 1002   BUN 12 07/07/2014 1038   CREATININE 0.7  07/06/2015 1002   CREATININE 0.76 07/07/2014 1038      Component Value Date/Time   CALCIUM 9.1 07/06/2015 1002   CALCIUM 9.4 07/07/2014 1038   ALKPHOS 42 07/06/2015 1002   ALKPHOS 43 07/07/2014 1038   AST 27 07/06/2015 1002   AST 22 07/07/2014 1038   ALT 23 07/06/2015 1002   ALT 19 07/07/2014 1038   BILITOT 0.60 07/06/2015 1002   BILITOT 0.5 07/07/2014 1038         Impression and Plan: Kristine Stewart is 60 year old white female with history of DCIS of the left breast. This was back 15 years ago. She had radiation therapy.  She's doing quite well.  For now, we will just get her back in 1 year.  I do not see a need for any type of scans.   [name], MD 1/13/201711:22 AM

## 2015-08-14 ENCOUNTER — Ambulatory Visit (HOSPITAL_BASED_OUTPATIENT_CLINIC_OR_DEPARTMENT_OTHER)
Admission: RE | Admit: 2015-08-14 | Discharge: 2015-08-14 | Disposition: A | Discharge status: Home / Self Care | Payer: BLUE CROSS/BLUE SHIELD | Source: Ambulatory Visit | Attending: Hematology & Oncology | Admitting: Hematology & Oncology

## 2015-08-14 DIAGNOSIS — Z1231 Encounter for screening mammogram for malignant neoplasm of breast: Secondary | ICD-10-CM | POA: Insufficient documentation

## 2016-07-09 ENCOUNTER — Other Ambulatory Visit: Payer: Self-pay | Admitting: Hematology & Oncology

## 2016-07-09 DIAGNOSIS — Z9289 Personal history of other medical treatment: Secondary | ICD-10-CM

## 2016-08-15 ENCOUNTER — Encounter (HOSPITAL_BASED_OUTPATIENT_CLINIC_OR_DEPARTMENT_OTHER): Payer: Self-pay

## 2016-08-15 ENCOUNTER — Ambulatory Visit (HOSPITAL_BASED_OUTPATIENT_CLINIC_OR_DEPARTMENT_OTHER)
Admission: RE | Admit: 2016-08-15 | Discharge: 2016-08-15 | Disposition: A | Discharge status: Home / Self Care | Payer: BLUE CROSS/BLUE SHIELD | Source: Ambulatory Visit | Attending: Hematology & Oncology | Admitting: Hematology & Oncology

## 2016-08-15 DIAGNOSIS — Z9289 Personal history of other medical treatment: Secondary | ICD-10-CM

## 2016-08-15 DIAGNOSIS — Z1231 Encounter for screening mammogram for malignant neoplasm of breast: Secondary | ICD-10-CM | POA: Diagnosis not present

## 2016-08-19 ENCOUNTER — Other Ambulatory Visit: Payer: Self-pay | Admitting: Hematology & Oncology

## 2016-08-19 DIAGNOSIS — R928 Other abnormal and inconclusive findings on diagnostic imaging of breast: Secondary | ICD-10-CM

## 2016-08-20 ENCOUNTER — Other Ambulatory Visit: Payer: BLUE CROSS/BLUE SHIELD

## 2016-08-20 ENCOUNTER — Ambulatory Visit: Payer: BLUE CROSS/BLUE SHIELD | Admitting: Hematology & Oncology

## 2016-08-22 ENCOUNTER — Ambulatory Visit
Admission: RE | Admit: 2016-08-22 | Discharge: 2016-08-22 | Disposition: A | Discharge status: Home / Self Care | Payer: BLUE CROSS/BLUE SHIELD | Source: Ambulatory Visit | Attending: Hematology & Oncology | Admitting: Hematology & Oncology

## 2016-08-22 DIAGNOSIS — R928 Other abnormal and inconclusive findings on diagnostic imaging of breast: Secondary | ICD-10-CM

## 2016-08-26 ENCOUNTER — Other Ambulatory Visit: Payer: Self-pay

## 2016-08-26 DIAGNOSIS — D0512 Intraductal carcinoma in situ of left breast: Secondary | ICD-10-CM

## 2016-08-27 ENCOUNTER — Ambulatory Visit (HOSPITAL_BASED_OUTPATIENT_CLINIC_OR_DEPARTMENT_OTHER): Payer: BLUE CROSS/BLUE SHIELD | Admitting: Family

## 2016-08-27 ENCOUNTER — Other Ambulatory Visit (HOSPITAL_BASED_OUTPATIENT_CLINIC_OR_DEPARTMENT_OTHER): Payer: BLUE CROSS/BLUE SHIELD

## 2016-08-27 VITALS — BP 128/67 | HR 78 | Temp 97.8°F | Resp 20 | Wt 159.8 lb

## 2016-08-27 DIAGNOSIS — Z86000 Personal history of in-situ neoplasm of breast: Secondary | ICD-10-CM | POA: Diagnosis not present

## 2016-08-27 DIAGNOSIS — D0512 Intraductal carcinoma in situ of left breast: Secondary | ICD-10-CM

## 2016-08-27 LAB — CBC WITH DIFFERENTIAL (CANCER CENTER ONLY)
BASO#: 0 10*3/uL (ref 0.0–0.2)
BASO%: 0.4 % (ref 0.0–2.0)
EOS%: 3 % (ref 0.0–7.0)
Eosinophils Absolute: 0.2 10*3/uL (ref 0.0–0.5)
HCT: 40.1 % (ref 34.8–46.6)
HEMOGLOBIN: 13.5 g/dL (ref 11.6–15.9)
LYMPH#: 1.8 10*3/uL (ref 0.9–3.3)
LYMPH%: 33.6 % (ref 14.0–48.0)
MCH: 30.2 pg (ref 26.0–34.0)
MCHC: 33.7 g/dL (ref 32.0–36.0)
MCV: 90 fL (ref 81–101)
MONO#: 0.5 10*3/uL (ref 0.1–0.9)
MONO%: 9.1 % (ref 0.0–13.0)
NEUT%: 53.9 % (ref 39.6–80.0)
NEUTROS ABS: 2.8 10*3/uL (ref 1.5–6.5)
Platelets: 239 10*3/uL (ref 145–400)
RBC: 4.47 10*6/uL (ref 3.70–5.32)
RDW: 12.7 % (ref 11.1–15.7)
WBC: 5.3 10*3/uL (ref 3.9–10.0)

## 2016-08-27 LAB — COMPREHENSIVE METABOLIC PANEL
ALBUMIN: 3.4 g/dL — AB (ref 3.5–5.0)
ALT: 23 U/L (ref 0–55)
AST: 22 U/L (ref 5–34)
Alkaline Phosphatase: 59 U/L (ref 40–150)
Anion Gap: 7 mEq/L (ref 3–11)
BUN: 15.5 mg/dL (ref 7.0–26.0)
CALCIUM: 9.7 mg/dL (ref 8.4–10.4)
CO2: 29 mEq/L (ref 22–29)
CREATININE: 0.7 mg/dL (ref 0.6–1.1)
Chloride: 105 mEq/L (ref 98–109)
EGFR: >90 mL/min/{1.73_m2} (ref >90)
GLUCOSE: 66 mg/dL — AB (ref 70–140)
Potassium: 4.1 mEq/L (ref 3.5–5.1)
SODIUM: 141 meq/L (ref 136–145)
TOTAL PROTEIN: 7 g/dL (ref 6.4–8.3)
Total Bilirubin: 0.48 mg/dL (ref 0.20–1.20)

## 2016-08-27 NOTE — Progress Notes (Signed)
Hematology and Oncology Follow Up Visit  Kristine Stewart [id number] 11/16/1955 61 y.o. 08/27/2016   Principle Diagnosis:  Ductal carcinoma in situ of the left breast  Current Therapy:   Observation    Interim History:  Kristine Stewart is here today for follow-up. She is doing quite well. She had her mammogram and breast US in march which showed a small benign retroareolar left breast cyst with no evidence of malignancy. Breast exam today was negative.  She is staying busy taking care of her husband who just had a hip replacement. She also enjoys going to the gym with a group of friends.  She has had no issue with infections. No fever, chills, n/v, cough, rash, dizziness, SOB, chest pain, palpitations, abdominal pain or changes in bowel or bladder habits.  No swelling, tenderness, numbness or tingling in her extremities. No c/o pain at this time.  She has maintained a good appetite and is staying well hydrated. Her weight is stable.   Medications:  Allergies as of 08/27/2016      Reactions   Codeine Itching      Medication List       Accurate as of 08/27/16  9:08 AM. Always use your most recent med list.          ADVIL PM PO Take by mouth at bedtime.   aspirin 81 MG EC tablet Take 81 mg by mouth. Swallow whole.   Calcium 150 MG Tabs Take by mouth every morning.   liothyronine 25 MCG tablet Commonly known as:  CYTOMEL Take 25 mcg by mouth 2 (two) times daily.   metoprolol succinate 100 MG 24 hr tablet Commonly known as:  TOPROL-XL Take 100 mg by mouth daily.   multivitamin with minerals Tabs tablet Take 1 tablet by mouth daily.   POTASSIUM & MAGNESIUM ASPARTAT PO Take 2 tablets by mouth at bedtime.   VITAMIN B-2 PO Take by mouth 2 (two) times daily.   Vitamin D3 2000 units Tabs Take by mouth every morning.       Allergies:  Allergies  Allergen Reactions  . Codeine Itching    Past Medical History, Surgical history, Social history, and Family History were  reviewed and updated.  Review of Systems: All other 10 point review of systems is negative.   Physical Exam:  vitals were not taken for this visit.  Wt Readings from Last 3 Encounters:  07/06/15 154 lb (69.9 kg)  07/07/14 143 lb (64.9 kg)  07/08/13 158 lb (71.7 kg)    Ocular: Sclerae unicteric, pupils equal, round and reactive to light Ear-nose-throat: Oropharynx clear, dentition fair Lymphatic: No cervical, supraclavicular or axillary adenopathy Lungs no rales or rhonchi, good excursion bilaterally Heart regular rate and rhythm, no murmur appreciated Abd soft, nontender, positive bowel sounds, no liver or spleen tip palpated on exam MSK no focal spinal tenderness, no joint edema Neuro: non-focal, well-oriented, appropriate affect Breasts: Left breast lumpectomy scar at 11 o'clock position around the nipple intact. No changes with right breast. No mass, lesion, rash or lymphadenopathy noted.   Lab Results  Component Value Date   WBC 5.3 08/27/2016   HGB 13.5 08/27/2016   HCT 40.1 08/27/2016   MCV 90 08/27/2016   PLT 239 08/27/2016   No results found for: FERRITIN, IRON, TIBC, UIBC, IRONPCTSAT Lab Results  Component Value Date   RBC 4.47 08/27/2016   No results found for: KPAFRELGTCHN, LAMBDASER, KAPLAMBRATIO No results found for: IGGSERUM, IGA, IGMSERUM No results found for: TOTALPROTELP, ALBUMINELP, A1GS,  Kristine Stewart, SPEI   Chemistry      Component Value Date/Time   NA 137 07/06/2015 1002   K 3.8 07/06/2015 1002   CL 98 07/06/2015 1002   CO2 29 07/06/2015 1002   BUN 13 07/06/2015 1002   CREATININE 0.7 07/06/2015 1002      Component Value Date/Time   CALCIUM 9.1 07/06/2015 1002   ALKPHOS 42 07/06/2015 1002   AST 27 07/06/2015 1002   ALT 23 07/06/2015 1002   BILITOT 0.60 07/06/2015 1002     Impression and Plan: Kristine Stewart is 61 yo white female with history of DCIS of the left breast over 15 years ago. She had a lumpectomy followed by  radiation therapy. She continues to do well and so far there has been no evidence of recurrence.  Breast exam today was negative and she continues to do self exams at home.  We will continue to follow along with her and plan to see her back in 1 year for follow-up and repeat lab work.  She will contact our office with any questions or concerns. We can certainly see her sooner if need be.   Eliezer Bottom, NP 3/7/20189:08 AM

## 2016-08-28 LAB — VITAMIN D 25 HYDROXY (VIT D DEFICIENCY, FRACTURES): Vitamin D, 25-Hydroxy: 57.1 ng/mL (ref 30.0–100.0)

## 2017-07-28 ENCOUNTER — Other Ambulatory Visit: Payer: Self-pay | Admitting: Hematology & Oncology

## 2017-07-28 DIAGNOSIS — Z1231 Encounter for screening mammogram for malignant neoplasm of breast: Secondary | ICD-10-CM

## 2017-08-25 ENCOUNTER — Ambulatory Visit (HOSPITAL_BASED_OUTPATIENT_CLINIC_OR_DEPARTMENT_OTHER)
Admission: RE | Admit: 2017-08-25 | Discharge: 2017-08-25 | Disposition: A | Discharge status: Home / Self Care | Payer: BLUE CROSS/BLUE SHIELD | Source: Ambulatory Visit | Attending: Hematology & Oncology | Admitting: Hematology & Oncology

## 2017-08-25 DIAGNOSIS — Z1231 Encounter for screening mammogram for malignant neoplasm of breast: Secondary | ICD-10-CM

## 2017-08-27 ENCOUNTER — Inpatient Hospital Stay (HOSPITAL_BASED_OUTPATIENT_CLINIC_OR_DEPARTMENT_OTHER): Payer: BLUE CROSS/BLUE SHIELD | Admitting: Hematology & Oncology

## 2017-08-27 ENCOUNTER — Encounter: Payer: Self-pay | Admitting: Hematology & Oncology

## 2017-08-27 ENCOUNTER — Inpatient Hospital Stay: Payer: BLUE CROSS/BLUE SHIELD | Attending: Hematology & Oncology

## 2017-08-27 ENCOUNTER — Other Ambulatory Visit: Payer: Self-pay

## 2017-08-27 VITALS — BP 129/79 | HR 76 | Temp 98.1°F | Resp 16 | Wt 164.0 lb

## 2017-08-27 DIAGNOSIS — Z79899 Other long term (current) drug therapy: Secondary | ICD-10-CM | POA: Insufficient documentation

## 2017-08-27 DIAGNOSIS — D0512 Intraductal carcinoma in situ of left breast: Secondary | ICD-10-CM

## 2017-08-27 DIAGNOSIS — Z7982 Long term (current) use of aspirin: Secondary | ICD-10-CM | POA: Insufficient documentation

## 2017-08-27 DIAGNOSIS — Z923 Personal history of irradiation: Secondary | ICD-10-CM

## 2017-08-27 DIAGNOSIS — Z86000 Personal history of in-situ neoplasm of breast: Secondary | ICD-10-CM | POA: Diagnosis not present

## 2017-08-27 LAB — COMPREHENSIVE METABOLIC PANEL
ALT: 28 U/L (ref 0–55)
ANION GAP: 9 (ref 3–11)
AST: 23 U/L (ref 5–34)
Albumin: 3.1 g/dL — ABNORMAL LOW (ref 3.5–5.0)
Alkaline Phosphatase: 56 U/L (ref 40–150)
BILIRUBIN TOTAL: 0.5 mg/dL (ref 0.2–1.2)
BUN: 12 mg/dL (ref 7–26)
CO2: 26 mmol/L (ref 22–29)
Calcium: 9.7 mg/dL (ref 8.4–10.4)
Chloride: 106 mmol/L (ref 98–109)
Creatinine, Ser: 0.76 mg/dL (ref 0.60–1.10)
GFR calc Af Amer: >60 mL/min (ref >60)
Glucose, Bld: 93 mg/dL (ref 70–140)
POTASSIUM: 3.9 mmol/L (ref 3.5–5.1)
Sodium: 141 mmol/L (ref 136–145)
TOTAL PROTEIN: 6.8 g/dL (ref 6.4–8.3)

## 2017-08-27 LAB — CBC WITH DIFFERENTIAL (CANCER CENTER ONLY)
BASOS PCT: 1 %
Basophils Absolute: 0 10*3/uL (ref 0.0–0.1)
EOS ABS: 0.2 10*3/uL (ref 0.0–0.5)
Eosinophils Relative: 3 %
HEMATOCRIT: 39 % (ref 34.8–46.6)
HEMOGLOBIN: 13.2 g/dL (ref 11.6–15.9)
Lymphocytes Relative: 33 %
Lymphs Abs: 2 10*3/uL (ref 0.9–3.3)
MCH: 30.3 pg (ref 26.0–34.0)
MCHC: 33.8 g/dL (ref 32.0–36.0)
MCV: 89.4 fL (ref 81.0–101.0)
Monocytes Absolute: 0.5 10*3/uL (ref 0.1–0.9)
Monocytes Relative: 8 %
NEUTROS ABS: 3.4 10*3/uL (ref 1.5–6.5)
NEUTROS PCT: 55 %
Platelet Count: 256 10*3/uL (ref 145–400)
RBC: 4.36 MIL/uL (ref 3.70–5.32)
RDW: 12.5 % (ref 11.1–15.7)
WBC: 6.2 10*3/uL (ref 3.9–10.0)

## 2017-08-27 NOTE — Progress Notes (Signed)
Hematology and Oncology Follow Up Visit  Kristine Stewart [id number] 1956-02-25 62 y.o. 08/27/2017   Principle Diagnosis:  Ductal carcinoma in situ of the left breast  Current Therapy:   Observation    Interim History:  Kristine Stewart is here today for follow-up. She is doing quite well. She had her mammogram and breast US in march which showed a small benign retroareolar left breast cyst with no evidence of malignancy. Breast exam today was negative.  She is staying busy taking care of her husband who just had a hip replacement. She also enjoys going to the gym with a group of friends.  She has had no issue with infections. No fever, chills, n/v, cough, rash, dizziness, SOB, chest pain, palpitations, abdominal pain or changes in bowel or bladder habits.  No swelling, tenderness, numbness or tingling in her extremities. No c/o pain at this time.  She has maintained a good appetite and is staying well hydrated. Her weight is stable.   Medications:  Allergies as of 08/27/2017      Reactions   Codeine Itching      Medication List        Accurate as of 08/27/17  9:31 AM. Always use your most recent med list.          ADVIL PM PO Take by mouth at bedtime.   aspirin 81 MG EC tablet Take 81 mg by mouth. Swallow whole.   Calcium 150 MG Tabs Take by mouth 2 (two) times daily.   liothyronine 25 MCG tablet Commonly known as:  CYTOMEL Take 25 mcg by mouth 2 (two) times daily.   Melatonin 10 MG Caps Take 10 mg by mouth at bedtime.   metoprolol succinate 100 MG 24 hr tablet Commonly known as:  TOPROL-XL Take 100 mg by mouth daily.   multivitamin with minerals Tabs tablet Take 1 tablet by mouth daily.   POTASSIUM & MAGNESIUM ASPARTAT PO Take 2 tablets by mouth at bedtime.   scopolamine 1 MG/3DAYS Commonly known as:  TRANSDERM-SCOP   simvastatin 40 MG tablet Commonly known as:  ZOCOR 40 mg.   VITAMIN B-2 PO Take by mouth 2 (two) times daily.   Vitamin D3 2000 units  Tabs Take by mouth every morning.   Zinc 15 MG Caps Take 15 mg by mouth daily.       Allergies:  Allergies  Allergen Reactions  . Codeine Itching    Past Medical History, Surgical history, Social history, and Family History were reviewed and updated.  Review of Systems: All other 10 point review of systems is negative.   Physical Exam:  weight is 164 lb (74.4 kg). Her oral temperature is 98.1 F (36.7 C). Her blood pressure is 129/79 and her pulse is 76. Her respiration is 16 and oxygen saturation is 99%.   Wt Readings from Last 3 Encounters:  08/27/17 164 lb (74.4 kg)  08/27/16 159 lb 12.8 oz (72.5 kg)  07/06/15 154 lb (69.9 kg)    Ocular: Sclerae unicteric, pupils equal, round and reactive to light Ear-nose-throat: Oropharynx clear, dentition fair Lymphatic: No cervical, supraclavicular or axillary adenopathy Lungs no rales or rhonchi, good excursion bilaterally Heart regular rate and rhythm, no murmur appreciated Abd soft, nontender, positive bowel sounds, no liver or spleen tip palpated on exam MSK no focal spinal tenderness, no joint edema Neuro: non-focal, well-oriented, appropriate affect Breasts: Left breast lumpectomy scar at 11 o'clock position around the nipple intact. No changes with right breast. No mass, lesion, rash or  lymphadenopathy noted.   Lab Results  Component Value Date   WBC 6.2 08/27/2017   HGB 13.5 08/27/2016   HCT 39.0 08/27/2017   MCV 89.4 08/27/2017   PLT 256 08/27/2017   No results found for: FERRITIN, IRON, TIBC, UIBC, IRONPCTSAT Lab Results  Component Value Date   RBC 4.36 08/27/2017   No results found for: KPAFRELGTCHN, LAMBDASER, KAPLAMBRATIO No results found for: IGGSERUM, IGA, IGMSERUM No results found for: Odetta Pink, SPEI   Chemistry      Component Value Date/Time   NA 141 08/27/2016 0854   K 4.1 08/27/2016 0854   CL 98 07/06/2015 1002   CO2 29 08/27/2016 0854    BUN 15.5 08/27/2016 0854   CREATININE 0.7 08/27/2016 0854      Component Value Date/Time   CALCIUM 9.7 08/27/2016 0854   ALKPHOS 59 08/27/2016 0854   AST 22 08/27/2016 0854   ALT 23 08/27/2016 0854   BILITOT 0.48 08/27/2016 0854     Impression and Plan: Ms. Garrow is 62 yo white female with history of DCIS of the left breast over 16 years ago. She had a lumpectomy followed by radiation therapy. She continues to do well and so far there has been no evidence of recurrence.  Breast exam today was negative and she continues to do self exams at home.  We will continue to follow along with her and plan to see her back in 1 year for follow-up and repeat lab work.  She will contact our office with any questions or concerns. We can certainly see her sooner if need be.   [name], MD 3/7/20199:31 AM

## 2017-08-28 LAB — VITAMIN D 25 HYDROXY (VIT D DEFICIENCY, FRACTURES): Vit D, 25-Hydroxy: 66.6 ng/mL (ref 30.0–100.0)

## 2018-07-21 ENCOUNTER — Other Ambulatory Visit (HOSPITAL_BASED_OUTPATIENT_CLINIC_OR_DEPARTMENT_OTHER): Payer: Self-pay | Admitting: Hematology & Oncology

## 2018-07-21 DIAGNOSIS — Z1231 Encounter for screening mammogram for malignant neoplasm of breast: Secondary | ICD-10-CM

## 2018-08-26 ENCOUNTER — Ambulatory Visit (HOSPITAL_BASED_OUTPATIENT_CLINIC_OR_DEPARTMENT_OTHER)
Admission: RE | Admit: 2018-08-26 | Discharge: 2018-08-26 | Disposition: A | Discharge status: Home / Self Care | Payer: 59 | Source: Ambulatory Visit | Attending: Hematology & Oncology | Admitting: Hematology & Oncology

## 2018-08-26 ENCOUNTER — Other Ambulatory Visit: Payer: Self-pay

## 2018-08-26 ENCOUNTER — Inpatient Hospital Stay: Payer: 59

## 2018-08-26 ENCOUNTER — Inpatient Hospital Stay: Payer: 59 | Attending: Hematology & Oncology | Admitting: Hematology & Oncology

## 2018-08-26 VITALS — BP 116/90 | HR 96 | Temp 97.9°F | Resp 18 | Wt 162.0 lb

## 2018-08-26 DIAGNOSIS — Z1231 Encounter for screening mammogram for malignant neoplasm of breast: Secondary | ICD-10-CM

## 2018-08-26 DIAGNOSIS — Z170 Estrogen receptor positive status [ER+]: Secondary | ICD-10-CM | POA: Diagnosis not present

## 2018-08-26 DIAGNOSIS — D0512 Intraductal carcinoma in situ of left breast: Secondary | ICD-10-CM

## 2018-08-26 DIAGNOSIS — E039 Hypothyroidism, unspecified: Secondary | ICD-10-CM | POA: Insufficient documentation

## 2018-08-26 DIAGNOSIS — Z923 Personal history of irradiation: Secondary | ICD-10-CM | POA: Diagnosis not present

## 2018-08-26 LAB — CMP (CANCER CENTER ONLY)
ALT: 27 U/L (ref 0–44)
AST: 22 U/L (ref 15–41)
Albumin: 4 g/dL (ref 3.5–5.0)
Alkaline Phosphatase: 50 U/L (ref 38–126)
Anion gap: 6 (ref 5–15)
BUN: 14 mg/dL (ref 8–23)
CALCIUM: 10 mg/dL (ref 8.9–10.3)
CHLORIDE: 102 mmol/L (ref 98–111)
CO2: 32 mmol/L (ref 22–32)
CREATININE: 0.74 mg/dL (ref 0.44–1.00)
GFR, Est AFR Am: >60 mL/min (ref >60)
Glucose, Bld: 104 mg/dL — ABNORMAL HIGH (ref 70–99)
POTASSIUM: 5.1 mmol/L (ref 3.5–5.1)
SODIUM: 140 mmol/L (ref 135–145)
TOTAL PROTEIN: 7.1 g/dL (ref 6.5–8.1)
Total Bilirubin: 0.6 mg/dL (ref 0.3–1.2)

## 2018-08-26 LAB — CBC WITH DIFFERENTIAL (CANCER CENTER ONLY)
ABS IMMATURE GRANULOCYTES: 0.02 10*3/uL (ref 0.00–0.07)
BASOS PCT: 1 %
Basophils Absolute: 0 10*3/uL (ref 0.0–0.1)
EOS ABS: 0.3 10*3/uL (ref 0.0–0.5)
EOS PCT: 4 %
HCT: 41.3 % (ref 36.0–46.0)
Hemoglobin: 13.8 g/dL (ref 12.0–15.0)
Immature Granulocytes: 0 %
Lymphocytes Relative: 37 %
Lymphs Abs: 2.4 10*3/uL (ref 0.7–4.0)
MCH: 29.8 pg (ref 26.0–34.0)
MCHC: 33.4 g/dL (ref 30.0–36.0)
MCV: 89.2 fL (ref 80.0–100.0)
MONO ABS: 0.6 10*3/uL (ref 0.1–1.0)
MONOS PCT: 9 %
NEUTROS ABS: 3.2 10*3/uL (ref 1.7–7.7)
Neutrophils Relative %: 49 %
PLATELETS: 294 10*3/uL (ref 150–400)
RBC: 4.63 MIL/uL (ref 3.87–5.11)
RDW: 12.1 % (ref 11.5–15.5)
WBC: 6.4 10*3/uL (ref 4.0–10.5)
nRBC: 0 % (ref 0.0–0.2)

## 2018-08-26 NOTE — Progress Notes (Signed)
Hematology and Oncology Follow Up Visit  Kristine Stewart [id number] 05-29-1956 63 y.o. 08/26/2018   Principle Diagnosis:  Ductal carcinoma in situ of the left breast  Current Therapy:   Observation    Interim History:  Kristine Stewart is here today for follow-up. She is doing quite well. She had her mammogram today.  She says that she was told that the mammogram looked okay.  She was very busy last year.  She and her husband went on a river cruise to [place].  It was very cold.  She is not sure she would go on another one.  She has had a problem with her thyroid.  She is now on Synthroid.  Her doctor is trying to get this regulated limit better.  She has had no problems with rashes.  She has had no change in bowel or bladder habits.  She has had no leg swelling.  There is been no cough.  She does not smoke.  She has been exercising.  She has a treadmill and weights at her house.  She is trying to lose some weight.  We actually made a wager today.  + We wager a candy bar that my [employer] would do better than her [employer].  If I when I get a [employer] bar.  If she wins she gets a Snickers bar.   Overall, her performance status is ECOG 0  Medications:  Allergies as of 08/26/2018      Reactions   Codeine Itching      Medication List       Accurate as of August 26, 2018  9:46 AM. Always use your most recent med list.        ADVIL PM PO Take by mouth at bedtime.   aspirin 81 MG EC tablet Take 81 mg by mouth. Swallow whole.   Calcium 150 MG Tabs Take by mouth 2 (two) times daily.   clobetasol 0.05 % external solution Commonly known as:  TEMOVATE   liothyronine 25 MCG tablet Commonly known as:  CYTOMEL Take 25 mcg by mouth 2 (two) times daily.   Melatonin 10 MG Caps Take 10 mg by mouth at bedtime.   metoprolol succinate 100 MG 24 hr tablet Commonly known as:  TOPROL-XL Take 100 mg by mouth daily.   multivitamin with minerals Tabs tablet Take 1  tablet by mouth daily.   scopolamine 1 MG/3DAYS Commonly known as:  TRANSDERM-SCOP   simvastatin 40 MG tablet Commonly known as:  ZOCOR 40 mg.   SUPER B COMPLEX/C PO Take 1 tablet by mouth daily.   SYNTHROID 50 MCG tablet Generic drug:  levothyroxine Take 50 mcg by mouth daily. Take 1/2 tablet, total of 25 mcg daily.   VITAMIN B-2 PO Take by mouth 2 (two) times daily.   Vitamin D3 50 MCG (2000 UT) Tabs Take by mouth every morning.       Allergies:  Allergies  Allergen Reactions  . Codeine Itching    Past Medical History, Surgical history, Social history, and Family History were reviewed and updated.  Review of Systems: Review of Systems  Constitutional: Negative.   HENT: Negative.   Eyes: Negative.   Respiratory: Negative.   Cardiovascular: Negative.   Gastrointestinal: Negative.   Genitourinary: Negative.   Musculoskeletal: Negative.   Skin: Negative.   Neurological: Negative.   Endo/Heme/Allergies: Negative.   Psychiatric/Behavioral: Negative.      Physical Exam:  weight is 162 lb (73.5 kg). Her oral temperature is 97.9  F (36.6 C). Her blood pressure is 116/90 and her pulse is 96. Her respiration is 18 and oxygen saturation is 99%.   Wt Readings from Last 3 Encounters:  08/26/18 162 lb (73.5 kg)  08/27/17 164 lb (74.4 kg)  08/27/16 159 lb 12.8 oz (72.5 kg)    Physical Exam Vitals signs reviewed.  HENT:     Head: Normocephalic and atraumatic.  Eyes:     Pupils: Pupils are equal, round, and reactive to light.  Neck:     Musculoskeletal: Normal range of motion.  Cardiovascular:     Rate and Rhythm: Normal rate and regular rhythm.     Heart sounds: Normal heart sounds.  Pulmonary:     Effort: Pulmonary effort is normal.     Breath sounds: Normal breath sounds.  Abdominal:     General: Bowel sounds are normal.     Palpations: Abdomen is soft.  Musculoskeletal: Normal range of motion.        General: No tenderness or deformity.    Lymphadenopathy:     Cervical: No cervical adenopathy.  Skin:    General: Skin is warm and dry.     Findings: No erythema or rash.  Neurological:     Mental Status: She is alert and oriented to person, place, and time.  Psychiatric:        Behavior: Behavior normal.        Thought Content: Thought content normal.        Judgment: Judgment normal.      Lab Results  Component Value Date   WBC 6.4 08/26/2018   HGB 13.8 08/26/2018   HCT 41.3 08/26/2018   MCV 89.2 08/26/2018   PLT 294 08/26/2018   No results found for: FERRITIN, IRON, TIBC, UIBC, IRONPCTSAT Lab Results  Component Value Date   RBC 4.63 08/26/2018   No results found for: KPAFRELGTCHN, LAMBDASER, KAPLAMBRATIO No results found for: Kandis Cocking, IGMSERUM No results found for: Odetta Pink, SPEI   Chemistry      Component Value Date/Time   NA 141 08/27/2017 0836   NA 141 08/27/2016 0854   K 3.9 08/27/2017 0836   K 4.1 08/27/2016 0854   CL 106 08/27/2017 0836   CL 98 07/06/2015 1002   CO2 26 08/27/2017 0836   CO2 29 08/27/2016 0854   BUN 12 08/27/2017 0836   BUN 15.5 08/27/2016 0854   CREATININE 0.76 08/27/2017 0836   CREATININE 0.7 08/27/2016 0854      Component Value Date/Time   CALCIUM 9.7 08/27/2017 0836   CALCIUM 9.7 08/27/2016 0854   ALKPHOS 56 08/27/2017 0836   ALKPHOS 59 08/27/2016 0854   AST 23 08/27/2017 0836   AST 22 08/27/2016 0854   ALT 28 08/27/2017 0836   ALT 23 08/27/2016 0854   BILITOT 0.5 08/27/2017 0836   BILITOT 0.48 08/27/2016 0854     Impression and Plan: Kristine Stewart is 63 yo white female with history of DCIS of the left breast over 17 years ago.   She had a lumpectomy followed by radiation therapy. She continues to do well and so far there has been no evidence of recurrence.   Breast exam today was negative and she continues to do self exams at home.   We will continue to follow along with her and plan to see her  back in 1 year for follow-up and repeat lab work.     [name], MD 3/5/20209:46 AM

## 2019-05-26 ENCOUNTER — Ambulatory Visit (INDEPENDENT_AMBULATORY_CARE_PROVIDER_SITE_OTHER): Payer: 59 | Admitting: Cardiology

## 2019-05-26 ENCOUNTER — Encounter: Payer: Self-pay | Admitting: Cardiology

## 2019-05-26 ENCOUNTER — Other Ambulatory Visit: Payer: Self-pay

## 2019-05-26 VITALS — BP 130/80 | HR 83 | Ht 63.0 in | Wt 166.0 lb

## 2019-05-26 DIAGNOSIS — E785 Hyperlipidemia, unspecified: Secondary | ICD-10-CM

## 2019-05-26 DIAGNOSIS — I341 Nonrheumatic mitral (valve) prolapse: Secondary | ICD-10-CM | POA: Diagnosis not present

## 2019-05-26 DIAGNOSIS — I479 Paroxysmal tachycardia, unspecified: Secondary | ICD-10-CM | POA: Diagnosis not present

## 2019-05-26 DIAGNOSIS — R002 Palpitations: Secondary | ICD-10-CM | POA: Diagnosis not present

## 2019-05-26 DIAGNOSIS — R0600 Dyspnea, unspecified: Secondary | ICD-10-CM

## 2019-05-26 DIAGNOSIS — R0609 Other forms of dyspnea: Secondary | ICD-10-CM | POA: Insufficient documentation

## 2019-05-26 NOTE — Patient Instructions (Signed)
Medication Instructions:  Your physician recommends that you continue on your current medications as directed. Please refer to the Current Medication list given to you today.  *If you need a refill on your cardiac medications before your next appointment, please call your pharmacy*  Lab Work: None.  If you have labs (blood work) drawn today and your tests are completely normal, you will receive your results only by: Marland Kitchen MyChart Message (if you have MyChart) OR . A paper copy in the mail If you have any lab test that is abnormal or we need to change your treatment, we will call you to review the results.  Testing/Procedures: Your physician has requested that you have an echocardiogram. Echocardiography is a painless test that uses sound waves to create images of your heart. It provides your doctor with information about the size and shape of your heart and how well your heart's chambers and valves are working. This procedure takes approximately one hour. There are no restrictions for this procedure.  Your physician has recommended that you wear a holter monitor. Holter monitors are medical devices that record the heart's electrical activity. Doctors most often use these monitors to diagnose arrhythmias. Arrhythmias are problems with the speed or rhythm of the heartbeat. The monitor is a small, portable device. You can wear one while you do your normal daily activities. This is usually used to diagnose what is causing palpitations/syncope (passing out). Wear for 7 days.      Follow-Up: At [facility], you and your health needs are our priority.  As part of our continuing mission to provide you with exceptional heart care, we have created designated Provider Care Teams.  These Care Teams include your primary Cardiologist (physician) and Advanced Practice Providers (APPs -  Physician Assistants and Nurse Practitioners) who all work together to provide you with the care you need, when you need it.   Your next appointment:   2 month(s)  The format for your next appointment:   In Person  Provider:   Jenne Campus, MD  Other Instructions   Echocardiogram An echocardiogram is a procedure that uses painless sound waves (ultrasound) to produce an image of the heart. Images from an echocardiogram can provide important information about:  Signs of coronary artery disease (CAD).  Aneurysm detection. An aneurysm is a weak or damaged part of an artery wall that bulges out from the normal force of blood pumping through the body.  Heart size and shape. Changes in the size or shape of the heart can be associated with certain conditions, including heart failure, aneurysm, and CAD.  Heart muscle function.  Heart valve function.  Signs of a past heart attack.  Fluid buildup around the heart.  Thickening of the heart muscle.  A tumor or infectious growth around the heart valves. Tell a health care provider about:  Any allergies you have.  All medicines you are taking, including vitamins, herbs, eye drops, creams, and over-the-counter medicines.  Any blood disorders you have.  Any surgeries you have had.  Any medical conditions you have.  Whether you are pregnant or may be pregnant. What are the risks? Generally, this is a safe procedure. However, problems may occur, including:  Allergic reaction to dye (contrast) that may be used during the procedure. What happens before the procedure? No specific preparation is needed. You may eat and drink normally. What happens during the procedure?   An IV tube may be inserted into one of your veins.  You may receive  contrast through this tube. A contrast is an injection that improves the quality of the pictures from your heart.  A gel will be applied to your chest.  A wand-like tool (transducer) will be moved over your chest. The gel will help to transmit the sound waves from the transducer.  The sound waves will harmlessly  bounce off of your heart to allow the heart images to be captured in real-time motion. The images will be recorded on a computer. The procedure may vary among health care providers and hospitals. What happens after the procedure?  You may return to your normal, everyday life, including diet, activities, and medicines, unless your health care provider tells you not to do that. Summary  An echocardiogram is a procedure that uses painless sound waves (ultrasound) to produce an image of the heart.  Images from an echocardiogram can provide important information about the size and shape of your heart, heart muscle function, heart valve function, and fluid buildup around your heart.  You do not need to do anything to prepare before this procedure. You may eat and drink normally.  After the echocardiogram is completed, you may return to your normal, everyday life, unless your health care provider tells you not to do that. This information is not intended to replace advice given to you by your health care provider. Make sure you discuss any questions you have with your health care provider. Document Released: 06/06/2000 Document Revised: 09/30/2018 Document Reviewed: 07/12/2016 Elsevier Patient Education  2020 [employer].

## 2019-05-26 NOTE — Progress Notes (Signed)
Cardiology Consultation:    Date:  05/26/2019   ID:  Kristine Stewart, DOB [birth year], MRN [id number]  PCP:  Lawerance Cruel, MD  Cardiologist:  Jenne Campus, MD   Referring MD: Lawerance Cruel, MD   Chief Complaint  Patient presents with  . Dizziness  . Tachycardia    when walking  . Hypertension    starting to go up lately    History of Present Illness:    Kristine Stewart is a 63 y.o. female who is being seen today for the evaluation of tachycardia at the request of Lawerance Cruel, MD.  In 2006 she was diagnosed with tachycardia I do not have specifics of this but she was put on Toprol-XL 100 mg which seems to be controlling arrhythmia perfectly.  She was also told at the time that she does have mitral valve prolapse, however there was no recommendation about any need to intervene because of this problem.  She would like to be reestablished as a patient.  Overall she seems to be doing well denies having a palpitations she exercised on the regular basis she likes to walk with her husband who happens to be my patient as well.  She walks about 3 times a week she does for about hour to hour and a half.  She is concerned about her heart rate going up with exercises up to 150.  Described to have 2 episodes when she became dizzy at one time she was walking and momentary sensation of dizziness she did not fell down did not passed out up she happened when she stopped exercising and second time when she was going up healed.  She did not have any chest pain tightness squeezing pressure burning chest.  There is no palpitation associated with this sensation.  No past medical history on file.  Past Surgical History:  Procedure Laterality Date  . BREAST EXCISIONAL BIOPSY Left 2000  . CHOLECYSTECTOMY      Current Medications: Current Meds  Medication Sig  . aspirin 81 MG EC tablet Take 81 mg by mouth. Swallow whole.  . Calcium 500 MG tablet Take 2 tablets by mouth 2 (two) times daily.    . Cholecalciferol (VITAMIN D3) 2000 UNITS TABS Take by mouth every morning.  . diphenhydrAMINE (BENADRYL) 25 MG tablet Take 25 mg by mouth at bedtime as needed.  Marland Kitchen liothyronine (CYTOMEL) 25 MCG tablet Take 12.5 mcg by mouth 2 (two) times daily.   . Melatonin 10 MG CAPS Take 10 mg by mouth at bedtime.  . metoprolol succinate (TOPROL-XL) 100 MG 24 hr tablet Take 100 mg by mouth daily.   . Multiple Vitamin (MULTIVITAMIN WITH MINERALS) TABS tablet Take 1 tablet by mouth daily.  . simvastatin (ZOCOR) 40 MG tablet 40 mg.  . SUPER B COMPLEX/C PO Take 1 tablet by mouth daily.  Marland Kitchen SYNTHROID 50 MCG tablet Take 50 mcg by mouth daily.      Allergies:   Codeine   Social History   Socioeconomic History  . Marital status: Married    Spouse name: Not on file  . Number of children: Not on file  . Years of education: Not on file  . Highest education level: Not on file  Occupational History  . Not on file  Social Needs  . Financial resource strain: Not on file  . Food insecurity    Worry: Not on file    Inability: Not on file  . Transportation needs    Medical: Not  on file    Non-medical: Not on file  Tobacco Use  . Smoking status: Former Smoker    Packs/day: 2.00    Years: 10.00    Pack years: 20.00    Types: Cigarettes    Start date: 10/06/1971    Quit date: 07/08/1981    Years since quitting: 37.9  . Smokeless tobacco: Never Used  . Tobacco comment: quit 34 years ago  Substance and Sexual Activity  . Alcohol use: Yes    Alcohol/week: 0.0 standard drinks  . Drug use: Never  . Sexual activity: Not on file  Lifestyle  . Physical activity    Days per week: Not on file    Minutes per session: Not on file  . Stress: Not on file  Relationships  . Social Herbalist on phone: Not on file    Gets together: Not on file    Attends religious service: Not on file    Active member of club or organization: Not on file    Attends meetings of clubs or organizations: Not on file     Relationship status: Not on file  Other Topics Concern  . Not on file  Social History Narrative  . Not on file     Family History: The patient's family history includes Breast cancer in her maternal aunt, mother, and sister. ROS:   Please see the history of present illness.    All 14 point review of systems negative except as described per history of present illness.  EKGs/Labs/Other Studies Reviewed:    The following studies were reviewed today:   EKG:  EKG is  ordered today.  The ekg ordered today demonstrates EKG showed normal sinus rhythm, normal P interval, normal QS complex duration morphology no ST segment changes  Recent Labs: 08/26/2018: ALT 27; BUN 14; Creatinine 0.74; Hemoglobin 13.8; Platelet Count 294; Potassium 5.1; Sodium 140  Recent Lipid Panel No results found for: CHOL, TRIG, HDL, CHOLHDL, VLDL, LDLCALC, LDLDIRECT  Physical Exam:    VS:  BP 130/80   Pulse 83   Ht 5\' 3"  (1.6 m)   Wt 166 lb (75.3 kg)   SpO2 98%   BMI 29.41 kg/m     Wt Readings from Last 3 Encounters:  05/26/19 166 lb (75.3 kg)  08/26/18 162 lb (73.5 kg)  08/27/17 164 lb (74.4 kg)     GEN:  Well nourished, well developed in no acute distress HEENT: Normal NECK: No JVD; No carotid bruits LYMPHATICS: No lymphadenopathy CARDIAC: RRR, no murmurs, no rubs, no gallops RESPIRATORY:  Clear to auscultation without rales, wheezing or rhonchi  ABDOMEN: Soft, non-tender, non-distended MUSCULOSKELETAL:  No edema; No deformity  SKIN: Warm and dry NEUROLOGIC:  Alert and oriented x 3 PSYCHIATRIC:  Normal affect   ASSESSMENT:    1. Mitral valve prolapse   2. Paroxysmal tachycardia ([place])   3. Dyslipidemia   4. Dyspnea on exertion    PLAN:    In order of problems listed above:  1. Mitral valve prolapse.  We will schedule her to have an echocardiogram to reassess the lesion.  Luckily on the physical examination I do not hear any click I do not hear any murmur. 2. Paroxysmal tachycardia have  no information about what kind tachycardia with dealing with.  Overall she is been taking metoprolol succinate for last 10 years with very good response and I will continue that medication especially in view of the fact that she also has high blood pressure  which is treated with this medication as well. 3. Dyslipidemia.  She does have mildly elevated LDL treated with Zocor.  Good news is that her HDL is also high. 4. Dyspnea on exertion overall doing well from that point review, echocardiogram will be scheduled to have a look at her LV as well as mitral prolapse.   Medication Adjustments/Labs and Tests Ordered: Current medicines are reviewed at length with the patient today.  Concerns regarding medicines are outlined above.  No orders of the defined types were placed in this encounter.  No orders of the defined types were placed in this encounter.   Signed, Park Liter, MD, [facility]. 05/26/2019 10:31 AM    [facility]

## 2019-05-30 ENCOUNTER — Ambulatory Visit (HOSPITAL_BASED_OUTPATIENT_CLINIC_OR_DEPARTMENT_OTHER)
Admission: RE | Admit: 2019-05-30 | Discharge: 2019-05-30 | Disposition: A | Discharge status: Home / Self Care | Payer: 59 | Source: Ambulatory Visit | Attending: Cardiology | Admitting: Cardiology

## 2019-05-30 ENCOUNTER — Other Ambulatory Visit: Payer: Self-pay

## 2019-05-30 DIAGNOSIS — I479 Paroxysmal tachycardia, unspecified: Secondary | ICD-10-CM | POA: Insufficient documentation

## 2019-05-30 DIAGNOSIS — R002 Palpitations: Secondary | ICD-10-CM | POA: Diagnosis present

## 2019-05-30 DIAGNOSIS — R0600 Dyspnea, unspecified: Secondary | ICD-10-CM | POA: Insufficient documentation

## 2019-05-30 NOTE — Progress Notes (Signed)
  Echocardiogram 2D Echocardiogram has been performed.  Cardell Peach 05/30/2019, 3:41 PM

## 2019-06-01 ENCOUNTER — Telehealth: Payer: Self-pay | Admitting: Emergency Medicine

## 2019-06-01 NOTE — Telephone Encounter (Signed)
Left message for patient to return call regarding results  

## 2019-06-07 ENCOUNTER — Ambulatory Visit (INDEPENDENT_AMBULATORY_CARE_PROVIDER_SITE_OTHER): Payer: 59

## 2019-06-07 DIAGNOSIS — I479 Paroxysmal tachycardia, unspecified: Secondary | ICD-10-CM

## 2019-06-07 DIAGNOSIS — R002 Palpitations: Secondary | ICD-10-CM | POA: Diagnosis not present

## 2019-06-07 DIAGNOSIS — R0600 Dyspnea, unspecified: Secondary | ICD-10-CM

## 2019-07-28 ENCOUNTER — Other Ambulatory Visit (HOSPITAL_BASED_OUTPATIENT_CLINIC_OR_DEPARTMENT_OTHER): Payer: Self-pay | Admitting: Family Medicine

## 2019-07-28 DIAGNOSIS — Z1231 Encounter for screening mammogram for malignant neoplasm of breast: Secondary | ICD-10-CM

## 2019-08-11 ENCOUNTER — Other Ambulatory Visit: Payer: Self-pay

## 2019-08-11 ENCOUNTER — Telehealth: Payer: Self-pay | Admitting: *Deleted

## 2019-08-11 ENCOUNTER — Encounter: Payer: Self-pay | Admitting: Cardiology

## 2019-08-11 ENCOUNTER — Telehealth (INDEPENDENT_AMBULATORY_CARE_PROVIDER_SITE_OTHER): Payer: No Typology Code available for payment source | Admitting: Cardiology

## 2019-08-11 VITALS — BP 124/81 | HR 70 | Ht 63.0 in | Wt 156.0 lb

## 2019-08-11 DIAGNOSIS — I340 Nonrheumatic mitral (valve) insufficiency: Secondary | ICD-10-CM | POA: Diagnosis not present

## 2019-08-11 DIAGNOSIS — I479 Paroxysmal tachycardia, unspecified: Secondary | ICD-10-CM | POA: Diagnosis not present

## 2019-08-11 DIAGNOSIS — E785 Hyperlipidemia, unspecified: Secondary | ICD-10-CM | POA: Diagnosis not present

## 2019-08-11 DIAGNOSIS — R0609 Other forms of dyspnea: Secondary | ICD-10-CM

## 2019-08-11 DIAGNOSIS — R0600 Dyspnea, unspecified: Secondary | ICD-10-CM | POA: Diagnosis not present

## 2019-08-11 NOTE — Telephone Encounter (Signed)

## 2019-08-11 NOTE — Progress Notes (Signed)
Virtual Visit via Video Note   This visit type was conducted due to national recommendations for restrictions regarding the COVID-19 Pandemic (e.g. social distancing) in an effort to limit this patient's exposure and mitigate transmission in our community.  Due to her co-morbid illnesses, this patient is at least at moderate risk for complications without adequate follow up.  This format is felt to be most appropriate for this patient at this time.  All issues noted in this document were discussed and addressed.  A limited physical exam was performed with this format.  Please refer to the patient's chart for her consent to telehealth for [facility].  Evaluation Performed:  Follow-up visit  This visit type was conducted due to national recommendations for restrictions regarding the COVID-19 Pandemic (e.g. social distancing).  This format is felt to be most appropriate for this patient at this time.  All issues noted in this document were discussed and addressed.  No physical exam was performed (except for noted visual exam findings with Video Visits).  Please refer to the patient's chart (MyChart message for video visits and phone note for telephone visits) for the patient's consent to telehealth for [facility].  Date:  08/11/2019  ID: Kristine Stewart, DOB 1955/09/11, MRN [id number]   Patient Location: [address] [place] [address]   Provider location:   [place] [facility]  PCP:  Lawerance Cruel, MD  Cardiologist:  Jenne Campus, MD     Chief Complaint: Doing well  History of Present Illness:    Kristine Stewart is a 64 y.o. female  who presents via audio/video conferencing for a telehealth visit today.  With past medical history significant for paroxysmal tachycardia.  She was also told to have mitral valve prolapse.  She had not seen any cardiologist for many years that she wanted to be establish as a patient in our cardiology practice.  Overall she is doing  well.  She is very active she does have a new puppy and she walks to the bathroom the regular basis she has no difficulty doing it.  No shortness of breath, no chest pain, no tightness, no pressure, no burning or squeezing in the chest.  Overall she is doing very well.   The patient does not have symptoms concerning for COVID-19 infection (fever, chills, cough, or new SHORTNESS OF BREATH).    Prior CV studies:   The following studies were reviewed today:  Echocardiogram done on 05/30/2019 showed: 1. Left ventricular ejection fraction, by visual estimation, is 60 to  65%. The left ventricle has normal function. There is no left ventricular  hypertrophy.  2. Left ventricular diastolic parameters are consistent with Grade II  diastolic dysfunction (pseudonormalization).  3. Global right ventricle has normal systolic function.The right  ventricular size is normal. No increase in right ventricular wall  thickness.  4. Left atrial size was normal.  5. Right atrial size was normal.  6. The mitral valve is normal in structure. Mild to moderate mitral valve  regurgitation. No evidence of mitral stenosis.  7. The tricuspid valve is normal in structure. Tricuspid valve  regurgitation moderate.  8. The aortic valve is normal in structure. Aortic valve regurgitation is  trivial. No evidence of aortic valve sclerosis or stenosis.  9. The pulmonic valve was not well visualized. Pulmonic valve  regurgitation is trivial.  10. Mildly elevated pulmonary artery systolic pressure.  Event recorder done on 30 June 2019 showed:  Narrow complex tachycardia 2 runs  but only 4 beats. Infrequent premature supraventricular beats  No past medical history on file.  Past Surgical History:  Procedure Laterality Date  . BREAST EXCISIONAL BIOPSY Left 2000  . CHOLECYSTECTOMY       Current Meds  Medication Sig  . Ascorbic Acid (VITAMIN C) 1000 MG tablet Take 1,000 mg by mouth daily.  Marland Kitchen aspirin 81  MG EC tablet Take 81 mg by mouth. Swallow whole.  . Calcium 500 MG tablet Take 2 tablets by mouth 2 (two) times daily.   . Cholecalciferol (VITAMIN D3) 2000 UNITS TABS Take by mouth every morning.  . clobetasol (TEMOVATE) 0.05 % external solution   . diphenhydrAMINE (BENADRYL) 25 MG tablet Take 25 mg by mouth at bedtime as needed.  Marland Kitchen liothyronine (CYTOMEL) 25 MCG tablet Take 12.5 mcg by mouth 2 (two) times daily.   . Melatonin 10 MG CAPS Take 10 mg by mouth at bedtime.  . metoprolol succinate (TOPROL-XL) 100 MG 24 hr tablet Take 100 mg by mouth daily.   . Multiple Vitamin (MULTIVITAMIN WITH MINERALS) TABS tablet Take 1 tablet by mouth daily.  . simvastatin (ZOCOR) 40 MG tablet Take 40 mg by mouth daily.   . SUPER B COMPLEX/C PO Take 1 tablet by mouth daily.  Marland Kitchen SYNTHROID 50 MCG tablet Take 50 mcg by mouth daily.       Family History: The patient's family history includes Breast cancer in her maternal aunt, mother, and sister.   ROS:   Please see the history of present illness.     All other systems reviewed and are negative.   Labs/Other Tests and Data Reviewed:     Recent Labs: 08/26/2018: ALT 27; BUN 14; Creatinine 0.74; Hemoglobin 13.8; Platelet Count 294; Potassium 5.1; Sodium 140  Recent Lipid Panel No results found for: CHOL, TRIG, HDL, CHOLHDL, VLDL, LDLCALC, LDLDIRECT    Exam:    Vital Signs:  BP 124/81   Pulse 70   Ht 5\' 3"  (1.6 m)   Wt 156 lb (70.8 kg)   BMI 27.63 kg/m     Wt Readings from Last 3 Encounters:  08/11/19 156 lb (70.8 kg)  05/26/19 166 lb (75.3 kg)  08/26/18 162 lb (73.5 kg)     Well nourished, well developed in no acute distress. Alert awake in exam 3 on talking to her JVD dealing.  She is on her home in my my home.  Today we have I see where therefore our office is closed.  She is doing very well.  She is asymptomatic.  Denies have any chest pain, tightness, squeezing, pressure, burning in the chest.  Diagnosis for this visit:   1.  Paroxysmal tachycardia ([place])   2. Nonrheumatic mitral valve regurgitation   3. Dyslipidemia   4. Dyspnea on exertion      ASSESSMENT & PLAN:    1.  Paroxysmal tachycardia.  Monitor showed short episodes of narrow complex tachycardia only 4 beats in the row, asymptomatic.  Continue present management which include beta-blocker. 2.  Nonrheumatic mitral valve regurgitation.  Only mild to moderate.  Normal left atrial size, no evidence of pulmonary hypertension.  No need to intervene.  I spoke in length to her about this issue I told her that I am not worried about the situation.  She will required an echocardiogram on the regular basis but otherwise no issues.  I warned her about potential signs and symptoms of worsening of the problem any shortness of breath palpitations swelling of lower extremities or  proximal nocturnal dyspnea.  She understood that she will let me know if it happens. 3.  Dyslipidemia we will contact primary care physician to get her fasting lipid profile. 4.  Dyspnea on exertion is stable not worsening.  Echocardiogram did not show any dramatic changes.  Overall she is doing well from that point review still very active which I encouraged her to do.  COVID-19 Education: The signs and symptoms of COVID-19 were discussed with the patient and how to seek care for testing (follow up with PCP or arrange E-visit).  The importance of social distancing was discussed today.  Patient Risk:   After full review of this patients clinical status, I feel that they are at least moderate risk at this time.  Time:   Today, I have spent 5 minutes with the patient with telehealth technology discussing pt health issues.  I spent 18 minutes reviewing her chart before the visit.  Visit was finished at 10:58 AM.    Medication Adjustments/Labs and Tests Ordered: Current medicines are reviewed at length with the patient today.  Concerns regarding medicines are outlined above.  No orders of the  defined types were placed in this encounter.  Medication changes: No orders of the defined types were placed in this encounter.    Disposition: Follow-up 6 months  Signed, Park Liter, MD, [facility] 08/11/2019 10:55 AM    [place]

## 2019-08-11 NOTE — Patient Instructions (Signed)

## 2019-09-01 ENCOUNTER — Other Ambulatory Visit: Payer: Self-pay | Admitting: *Deleted

## 2019-09-01 DIAGNOSIS — D0512 Intraductal carcinoma in situ of left breast: Secondary | ICD-10-CM

## 2019-09-02 ENCOUNTER — Other Ambulatory Visit: Payer: Self-pay

## 2019-09-02 ENCOUNTER — Encounter: Payer: Self-pay | Admitting: Hematology & Oncology

## 2019-09-02 ENCOUNTER — Inpatient Hospital Stay: Payer: 59

## 2019-09-02 ENCOUNTER — Ambulatory Visit (HOSPITAL_BASED_OUTPATIENT_CLINIC_OR_DEPARTMENT_OTHER)
Admission: RE | Admit: 2019-09-02 | Discharge: 2019-09-02 | Disposition: A | Discharge status: Home / Self Care | Payer: 59 | Source: Ambulatory Visit | Attending: Family Medicine | Admitting: Family Medicine

## 2019-09-02 ENCOUNTER — Inpatient Hospital Stay: Payer: 59 | Attending: Hematology & Oncology | Admitting: Hematology & Oncology

## 2019-09-02 VITALS — BP 114/80 | HR 65 | Temp 97.1°F | Resp 16 | Wt 154.0 lb

## 2019-09-02 DIAGNOSIS — Z86000 Personal history of in-situ neoplasm of breast: Secondary | ICD-10-CM | POA: Insufficient documentation

## 2019-09-02 DIAGNOSIS — Z7982 Long term (current) use of aspirin: Secondary | ICD-10-CM | POA: Insufficient documentation

## 2019-09-02 DIAGNOSIS — Z923 Personal history of irradiation: Secondary | ICD-10-CM | POA: Diagnosis not present

## 2019-09-02 DIAGNOSIS — Z8616 Personal history of COVID-19: Secondary | ICD-10-CM | POA: Insufficient documentation

## 2019-09-02 DIAGNOSIS — D0512 Intraductal carcinoma in situ of left breast: Secondary | ICD-10-CM

## 2019-09-02 DIAGNOSIS — R634 Abnormal weight loss: Secondary | ICD-10-CM | POA: Diagnosis not present

## 2019-09-02 DIAGNOSIS — Z79899 Other long term (current) drug therapy: Secondary | ICD-10-CM | POA: Insufficient documentation

## 2019-09-02 DIAGNOSIS — Z1231 Encounter for screening mammogram for malignant neoplasm of breast: Secondary | ICD-10-CM | POA: Diagnosis not present

## 2019-09-02 LAB — CBC WITH DIFFERENTIAL (CANCER CENTER ONLY)
Abs Immature Granulocytes: 0.02 10*3/uL (ref 0.00–0.07)
Basophils Absolute: 0 10*3/uL (ref 0.0–0.1)
Basophils Relative: 1 %
Eosinophils Absolute: 0.2 10*3/uL (ref 0.0–0.5)
Eosinophils Relative: 4 %
HCT: 40.7 % (ref 36.0–46.0)
Hemoglobin: 13.6 g/dL (ref 12.0–15.0)
Immature Granulocytes: 0 %
Lymphocytes Relative: 32 %
Lymphs Abs: 2 10*3/uL (ref 0.7–4.0)
MCH: 30.2 pg (ref 26.0–34.0)
MCHC: 33.4 g/dL (ref 30.0–36.0)
MCV: 90.4 fL (ref 80.0–100.0)
Monocytes Absolute: 0.5 10*3/uL (ref 0.1–1.0)
Monocytes Relative: 8 %
Neutro Abs: 3.5 10*3/uL (ref 1.7–7.7)
Neutrophils Relative %: 55 %
Platelet Count: 257 10*3/uL (ref 150–400)
RBC: 4.5 MIL/uL (ref 3.87–5.11)
RDW: 12.1 % (ref 11.5–15.5)
WBC Count: 6.2 10*3/uL (ref 4.0–10.5)
nRBC: 0 % (ref 0.0–0.2)

## 2019-09-02 LAB — CMP (CANCER CENTER ONLY)
ALT: 20 U/L (ref 0–44)
AST: 21 U/L (ref 15–41)
Albumin: 4 g/dL (ref 3.5–5.0)
Alkaline Phosphatase: 54 U/L (ref 38–126)
Anion gap: 4 — ABNORMAL LOW (ref 5–15)
BUN: 13 mg/dL (ref 8–23)
CO2: 33 mmol/L — ABNORMAL HIGH (ref 22–32)
Calcium: 9.8 mg/dL (ref 8.9–10.3)
Chloride: 104 mmol/L (ref 98–111)
Creatinine: 0.76 mg/dL (ref 0.44–1.00)
GFR, Est AFR Am: >60 mL/min (ref >60)
GFR, Estimated: >60 mL/min (ref >60)
Glucose, Bld: 88 mg/dL (ref 70–99)
Potassium: 4.7 mmol/L (ref 3.5–5.1)
Sodium: 141 mmol/L (ref 135–145)
Total Bilirubin: 0.4 mg/dL (ref 0.3–1.2)
Total Protein: 6.9 g/dL (ref 6.5–8.1)

## 2019-09-02 NOTE — Progress Notes (Signed)
Hematology and Oncology Follow Up Visit  Kristine Stewart [id number] 04-26-1956 64 y.o. 09/02/2019   Principle Diagnosis:  Ductal carcinoma in situ of the left breast  Current Therapy:   Observation    Interim History:  Kristine Stewart is here today for follow-up. Kristine Stewart is doing quite well. Kristine Stewart had her mammogram today.  Kristine Stewart says that Kristine Stewart was told that the mammogram looked okay.  Kristine Stewart managed to get through the coronavirus without any problems.  Kristine Stewart has had no problems with infection.  Kristine Stewart has been active.  Kristine Stewart is lost 8 pounds.  Kristine Stewart and her husband will celebrate their 60th wedding anniversary.  Next year, they will go on a trip to [place].  Kristine Stewart paid off her that to me.  I got my candy bar because the [employer] did better than the [employer] this year.  Kristine Stewart has had no problems with bowels or bladder.  There is no cough.  Kristine Stewart has had no rashes.  Kristine Stewart has had no leg swelling.     Overall, her performance status is ECOG 0  Medications:  Allergies as of 09/02/2019      Reactions   Codeine Itching      Medication List       Accurate as of September 02, 2019  9:38 AM. If you have any questions, ask your nurse or doctor.        aspirin 81 MG EC tablet Take 81 mg by mouth. Swallow whole.   Calcium 500 MG tablet Take 2 tablets by mouth 2 (two) times daily.   clobetasol 0.05 % external solution Commonly known as: TEMOVATE   diphenhydrAMINE 25 MG tablet Commonly known as: BENADRYL Take 25 mg by mouth at bedtime as needed.   liothyronine 25 MCG tablet Commonly known as: CYTOMEL Take 12.5 mcg by mouth 2 (two) times daily.   Melatonin 10 MG Caps Take 10 mg by mouth at bedtime.   metoprolol succinate 100 MG 24 hr tablet Commonly known as: TOPROL-XL Take 100 mg by mouth daily.   multivitamin with minerals Tabs tablet Take 1 tablet by mouth daily.   simvastatin 40 MG tablet Commonly known as: ZOCOR Take 40 mg by mouth daily.   SUPER B COMPLEX/C PO Take 1 tablet by  mouth daily.   Synthroid 50 MCG tablet Generic drug: levothyroxine Take 50 mcg by mouth daily.   vitamin C 1000 MG tablet Take 1,000 mg by mouth daily.   Vitamin D3 50 MCG (2000 UT) Tabs Take by mouth every morning.       Allergies:  Allergies  Allergen Reactions  . Codeine Itching    Past Medical History, Surgical history, Social history, and Family History were reviewed and updated.  Review of Systems: Review of Systems  Constitutional: Negative.   HENT: Negative.   Eyes: Negative.   Respiratory: Negative.   Cardiovascular: Negative.   Gastrointestinal: Negative.   Genitourinary: Negative.   Musculoskeletal: Negative.   Skin: Negative.   Neurological: Negative.   Endo/Heme/Allergies: Negative.   Psychiatric/Behavioral: Negative.      Physical Exam:  vitals were not taken for this visit.   Wt Readings from Last 3 Encounters:  08/11/19 156 lb (70.8 kg)  05/26/19 166 lb (75.3 kg)  08/26/18 162 lb (73.5 kg)    Physical Exam Vitals reviewed.  HENT:     Head: Normocephalic and atraumatic.  Eyes:     Pupils: Pupils are equal, round, and reactive to light.  Cardiovascular:     Rate  and Rhythm: Normal rate and regular rhythm.     Heart sounds: Normal heart sounds.  Pulmonary:     Effort: Pulmonary effort is normal.     Breath sounds: Normal breath sounds.  Abdominal:     General: Bowel sounds are normal.     Palpations: Abdomen is soft.  Musculoskeletal:        General: No tenderness or deformity. Normal range of motion.     Cervical back: Normal range of motion.  Lymphadenopathy:     Cervical: No cervical adenopathy.  Skin:    General: Skin is warm and dry.     Findings: No erythema or rash.  Neurological:     Mental Status: Kristine Stewart is alert and oriented to person, place, and time.  Psychiatric:        Behavior: Behavior normal.        Thought Content: Thought content normal.        Judgment: Judgment normal.      Lab Results  Component Value  Date   WBC 6.2 09/02/2019   HGB 13.6 09/02/2019   HCT 40.7 09/02/2019   MCV 90.4 09/02/2019   PLT 257 09/02/2019   No results found for: FERRITIN, IRON, TIBC, UIBC, IRONPCTSAT Lab Results  Component Value Date   RBC 4.50 09/02/2019   No results found for: KPAFRELGTCHN, LAMBDASER, KAPLAMBRATIO No results found for: IGGSERUM, IGA, IGMSERUM No results found for: Odetta Pink, SPEI   Chemistry      Component Value Date/Time   NA 140 08/26/2018 0847   NA 141 08/27/2016 0854   K 5.1 08/26/2018 0847   K 4.1 08/27/2016 0854   CL 102 08/26/2018 0847   CL 98 07/06/2015 1002   CO2 32 08/26/2018 0847   CO2 29 08/27/2016 0854   BUN 14 08/26/2018 0847   BUN 15.5 08/27/2016 0854   CREATININE 0.74 08/26/2018 0847   CREATININE 0.7 08/27/2016 0854      Component Value Date/Time   CALCIUM 10.0 08/26/2018 0847   CALCIUM 9.7 08/27/2016 0854   ALKPHOS 50 08/26/2018 0847   ALKPHOS 59 08/27/2016 0854   AST 22 08/26/2018 0847   AST 22 08/27/2016 0854   ALT 27 08/26/2018 0847   ALT 23 08/27/2016 0854   BILITOT 0.6 08/26/2018 0847   BILITOT 0.48 08/27/2016 0854     Impression and Plan: Kristine Stewart is 63 yo white female with history of DCIS of the left breast over 17 years ago.   Kristine Stewart had a lumpectomy followed by radiation therapy. Kristine Stewart continues to do well and so far there has been no evidence of recurrence.   Breast exam today was negative and Kristine Stewart continues to do self exams at home.   We will continue to follow along with her and plan to see her back in 1 year for follow-up and repeat lab work.     Kristine Napoleon, MD 3/12/20219:38 AM

## 2019-09-05 ENCOUNTER — Other Ambulatory Visit: Payer: Self-pay | Admitting: Family Medicine

## 2019-09-05 DIAGNOSIS — R928 Other abnormal and inconclusive findings on diagnostic imaging of breast: Secondary | ICD-10-CM

## 2019-09-08 ENCOUNTER — Telehealth: Payer: Self-pay | Admitting: Hematology & Oncology

## 2019-09-08 NOTE — Telephone Encounter (Signed)
Appointments scheduled calendar mailed per 3/12 los  

## 2019-09-21 ENCOUNTER — Ambulatory Visit
Admission: RE | Admit: 2019-09-21 | Discharge: 2019-09-21 | Disposition: A | Discharge status: Home / Self Care | Payer: 59 | Source: Ambulatory Visit | Attending: Family Medicine | Admitting: Family Medicine

## 2019-09-21 ENCOUNTER — Other Ambulatory Visit: Payer: Self-pay

## 2019-09-21 DIAGNOSIS — R928 Other abnormal and inconclusive findings on diagnostic imaging of breast: Secondary | ICD-10-CM

## 2020-08-21 ENCOUNTER — Other Ambulatory Visit (HOSPITAL_BASED_OUTPATIENT_CLINIC_OR_DEPARTMENT_OTHER): Payer: Self-pay | Admitting: Family Medicine

## 2020-08-21 DIAGNOSIS — Z1231 Encounter for screening mammogram for malignant neoplasm of breast: Secondary | ICD-10-CM

## 2020-08-31 ENCOUNTER — Inpatient Hospital Stay (HOSPITAL_BASED_OUTPATIENT_CLINIC_OR_DEPARTMENT_OTHER): Payer: No Typology Code available for payment source | Admitting: Hematology & Oncology

## 2020-08-31 ENCOUNTER — Inpatient Hospital Stay: Payer: No Typology Code available for payment source | Attending: Hematology & Oncology

## 2020-08-31 ENCOUNTER — Encounter: Payer: Self-pay | Admitting: Hematology & Oncology

## 2020-08-31 ENCOUNTER — Telehealth: Payer: Self-pay | Admitting: *Deleted

## 2020-08-31 ENCOUNTER — Other Ambulatory Visit: Payer: Self-pay

## 2020-08-31 VITALS — BP 140/73 | HR 74 | Temp 97.8°F | Resp 18 | Wt 161.0 lb

## 2020-08-31 DIAGNOSIS — Z86000 Personal history of in-situ neoplasm of breast: Secondary | ICD-10-CM | POA: Diagnosis present

## 2020-08-31 DIAGNOSIS — D0512 Intraductal carcinoma in situ of left breast: Secondary | ICD-10-CM

## 2020-08-31 DIAGNOSIS — Z923 Personal history of irradiation: Secondary | ICD-10-CM | POA: Diagnosis not present

## 2020-08-31 LAB — CBC WITH DIFFERENTIAL (CANCER CENTER ONLY)
Abs Immature Granulocytes: 0.01 10*3/uL (ref 0.00–0.07)
Basophils Absolute: 0.1 10*3/uL (ref 0.0–0.1)
Basophils Relative: 1 %
Eosinophils Absolute: 0.1 10*3/uL (ref 0.0–0.5)
Eosinophils Relative: 2 %
HCT: 39.4 % (ref 36.0–46.0)
Hemoglobin: 13.3 g/dL (ref 12.0–15.0)
Immature Granulocytes: 0 %
Lymphocytes Relative: 33 %
Lymphs Abs: 2 10*3/uL (ref 0.7–4.0)
MCH: 30.4 pg (ref 26.0–34.0)
MCHC: 33.8 g/dL (ref 30.0–36.0)
MCV: 90 fL (ref 80.0–100.0)
Monocytes Absolute: 0.5 10*3/uL (ref 0.1–1.0)
Monocytes Relative: 7 %
Neutro Abs: 3.5 10*3/uL (ref 1.7–7.7)
Neutrophils Relative %: 57 %
Platelet Count: 239 10*3/uL (ref 150–400)
RBC: 4.38 MIL/uL (ref 3.87–5.11)
RDW: 11.9 % (ref 11.5–15.5)
WBC Count: 6.1 10*3/uL (ref 4.0–10.5)
nRBC: 0 % (ref 0.0–0.2)

## 2020-08-31 LAB — CMP (CANCER CENTER ONLY)
ALT: 19 U/L (ref 0–44)
AST: 20 U/L (ref 15–41)
Albumin: 3.8 g/dL (ref 3.5–5.0)
Alkaline Phosphatase: 47 U/L (ref 38–126)
Anion gap: 6 (ref 5–15)
BUN: 12 mg/dL (ref 8–23)
CO2: 31 mmol/L (ref 22–32)
Calcium: 9.6 mg/dL (ref 8.9–10.3)
Chloride: 101 mmol/L (ref 98–111)
Creatinine: 0.76 mg/dL (ref 0.44–1.00)
GFR, Estimated: >60 mL/min (ref >60)
Glucose, Bld: 125 mg/dL — ABNORMAL HIGH (ref 70–99)
Potassium: 4 mmol/L (ref 3.5–5.1)
Sodium: 138 mmol/L (ref 135–145)
Total Bilirubin: 0.4 mg/dL (ref 0.3–1.2)
Total Protein: 6.4 g/dL — ABNORMAL LOW (ref 6.5–8.1)

## 2020-08-31 NOTE — Progress Notes (Signed)
Hematology and Oncology Follow Up Visit  Kristine Stewart [id number] 1956-05-04 65 y.o. 08/31/2020   Principle Diagnosis:  Ductal carcinoma in situ of the left breast  Current Therapy:   Observation    Interim History:  Ms. Kristine Stewart is here today for follow-up. She is doing quite well.  We see her once a year.  Every time was here we was have a good chat.  She is doing quite nicely.  She has grandkids that are actually in high school and college.  This is amazing since she really is not much older than myself.  They are getting ready to go on a trip down to [place], [place] and [place].  I am sure she will have a good time down there.    Her last mammogram was the end of March of last year.  Everything looked fine.  She has had no problems with fever sweats or chills.  There is been no change in bowel or bladder habits.  She has had no rashes.  There has been no cough or shortness of breath.  She has had no leg swelling.  There is been no change in medications.  Overall, her performance status is ECOG 1.     Medications:  Allergies as of 08/31/2020      Reactions   Codeine Itching      Medication List       Accurate as of August 31, 2020 10:09 AM. If you have any questions, ask your nurse or doctor.        aspirin 81 MG EC tablet Take 81 mg by mouth. Swallow whole.   Calcium 500 MG tablet Take 2 tablets by mouth 2 (two) times daily.   clobetasol 0.05 % external solution Commonly known as: TEMOVATE   diphenhydrAMINE 25 MG tablet Commonly known as: BENADRYL Take 25 mg by mouth at bedtime as needed.   liothyronine 25 MCG tablet Commonly known as: CYTOMEL Take 12.5 mcg by mouth 2 (two) times daily.   Melatonin 10 MG Caps Take 10 mg by mouth at bedtime.   metoprolol succinate 100 MG 24 hr tablet Commonly known as: TOPROL-XL Take 100 mg by mouth daily.   multivitamin with minerals Tabs tablet Take 1 tablet by mouth daily.   simvastatin 40 MG  tablet Commonly known as: ZOCOR Take 40 mg by mouth daily.   SUPER B COMPLEX/C PO Take 1 tablet by mouth daily.   Synthroid 50 MCG tablet Generic drug: levothyroxine Take 50 mcg by mouth daily.   vitamin C 1000 MG tablet Take 1,000 mg by mouth daily.   Vitamin D3 50 MCG (2000 UT) Tabs Take by mouth every morning.       Allergies:  Allergies  Allergen Reactions  . Codeine Itching    Past Medical History, Surgical history, Social history, and Family History were reviewed and updated.  Review of Systems: Review of Systems  Constitutional: Negative.   HENT: Negative.   Eyes: Negative.   Respiratory: Negative.   Cardiovascular: Negative.   Gastrointestinal: Negative.   Genitourinary: Negative.   Musculoskeletal: Negative.   Skin: Negative.   Neurological: Negative.   Endo/Heme/Allergies: Negative.   Psychiatric/Behavioral: Negative.      Physical Exam:  weight is 161 lb (73 kg). Her oral temperature is 97.8 F (36.6 C). Her blood pressure is 140/73 and her pulse is 74. Her respiration is 18 and oxygen saturation is 100%.   Wt Readings from Last 3 Encounters:  08/31/20 161 lb (73 kg)  09/02/19 154 lb (69.9 kg)  08/11/19 156 lb (70.8 kg)    Physical Exam Vitals reviewed.  HENT:     Head: Normocephalic and atraumatic.  Eyes:     Pupils: Pupils are equal, round, and reactive to light.  Cardiovascular:     Rate and Rhythm: Normal rate and regular rhythm.     Heart sounds: Normal heart sounds.  Pulmonary:     Effort: Pulmonary effort is normal.     Breath sounds: Normal breath sounds.  Abdominal:     General: Bowel sounds are normal.     Palpations: Abdomen is soft.  Musculoskeletal:        General: No tenderness or deformity. Normal range of motion.     Cervical back: Normal range of motion.  Lymphadenopathy:     Cervical: No cervical adenopathy.  Skin:    General: Skin is warm and dry.     Findings: No erythema or rash.  Neurological:     Mental  Status: She is alert and oriented to person, place, and time.  Psychiatric:        Behavior: Behavior normal.        Thought Content: Thought content normal.        Judgment: Judgment normal.      Lab Results  Component Value Date   WBC 6.1 08/31/2020   HGB 13.3 08/31/2020   HCT 39.4 08/31/2020   MCV 90.0 08/31/2020   PLT 239 08/31/2020   No results found for: FERRITIN, IRON, TIBC, UIBC, IRONPCTSAT Lab Results  Component Value Date   RBC 4.38 08/31/2020   No results found for: KPAFRELGTCHN, LAMBDASER, KAPLAMBRATIO No results found for: Kandis Cocking, IGMSERUM No results found for: Odetta Pink, SPEI   Chemistry      Component Value Date/Time   NA 138 08/31/2020 0857   NA 141 08/27/2016 0854   K 4.0 08/31/2020 0857   K 4.1 08/27/2016 0854   CL 101 08/31/2020 0857   CL 98 07/06/2015 1002   CO2 31 08/31/2020 0857   CO2 29 08/27/2016 0854   BUN 12 08/31/2020 0857   BUN 15.5 08/27/2016 0854   CREATININE 0.76 08/31/2020 0857   CREATININE 0.7 08/27/2016 0854      Component Value Date/Time   CALCIUM 9.6 08/31/2020 0857   CALCIUM 9.7 08/27/2016 0854   ALKPHOS 47 08/31/2020 0857   ALKPHOS 59 08/27/2016 0854   AST 20 08/31/2020 0857   AST 22 08/27/2016 0854   ALT 19 08/31/2020 0857   ALT 23 08/27/2016 0854   BILITOT 0.4 08/31/2020 0857   BILITOT 0.48 08/27/2016 0854     Impression and Plan: Ms. Kristine Stewart is 65 yo white female with history of DCIS of the left breast over 18 years ago.   She had a lumpectomy followed by radiation therapy. She continues to do well and so far there has been no evidence of recurrence.   Breast exam today was negative and she continues to do self exams at home.   We will continue to follow along with her and plan to see her back in 1 year for follow-up and repeat lab work.     [name], MD 3/11/202210:09 AM

## 2020-08-31 NOTE — Telephone Encounter (Signed)
Per los 08/31/20 - gave patient upcoming appointments - view mychart

## 2020-09-25 ENCOUNTER — Ambulatory Visit (HOSPITAL_BASED_OUTPATIENT_CLINIC_OR_DEPARTMENT_OTHER)
Admission: RE | Admit: 2020-09-25 | Discharge: 2020-09-25 | Disposition: A | Discharge status: Home / Self Care | Payer: No Typology Code available for payment source | Source: Ambulatory Visit | Attending: Family Medicine | Admitting: Family Medicine

## 2020-09-25 ENCOUNTER — Encounter (HOSPITAL_BASED_OUTPATIENT_CLINIC_OR_DEPARTMENT_OTHER): Payer: Self-pay

## 2020-09-25 ENCOUNTER — Other Ambulatory Visit: Payer: Self-pay

## 2020-09-25 DIAGNOSIS — Z1231 Encounter for screening mammogram for malignant neoplasm of breast: Secondary | ICD-10-CM | POA: Diagnosis not present

## 2021-08-15 ENCOUNTER — Other Ambulatory Visit (HOSPITAL_BASED_OUTPATIENT_CLINIC_OR_DEPARTMENT_OTHER): Payer: Self-pay | Admitting: Hematology & Oncology

## 2021-08-15 DIAGNOSIS — Z1231 Encounter for screening mammogram for malignant neoplasm of breast: Secondary | ICD-10-CM

## 2021-08-28 DIAGNOSIS — E039 Hypothyroidism, unspecified: Secondary | ICD-10-CM | POA: Diagnosis not present

## 2021-09-02 ENCOUNTER — Encounter: Payer: Self-pay | Admitting: Hematology & Oncology

## 2021-09-02 ENCOUNTER — Other Ambulatory Visit: Payer: Self-pay

## 2021-09-02 ENCOUNTER — Inpatient Hospital Stay: Payer: Medicare HMO | Attending: Hematology & Oncology

## 2021-09-02 ENCOUNTER — Other Ambulatory Visit: Payer: Self-pay | Admitting: *Deleted

## 2021-09-02 ENCOUNTER — Inpatient Hospital Stay (HOSPITAL_BASED_OUTPATIENT_CLINIC_OR_DEPARTMENT_OTHER): Payer: Medicare HMO | Admitting: Hematology & Oncology

## 2021-09-02 VITALS — BP 123/69 | HR 74 | Temp 97.8°F | Resp 20 | Wt 163.1 lb

## 2021-09-02 DIAGNOSIS — D0512 Intraductal carcinoma in situ of left breast: Secondary | ICD-10-CM | POA: Diagnosis not present

## 2021-09-02 DIAGNOSIS — E079 Disorder of thyroid, unspecified: Secondary | ICD-10-CM | POA: Diagnosis not present

## 2021-09-02 DIAGNOSIS — Z86000 Personal history of in-situ neoplasm of breast: Secondary | ICD-10-CM | POA: Diagnosis not present

## 2021-09-02 DIAGNOSIS — Z923 Personal history of irradiation: Secondary | ICD-10-CM | POA: Insufficient documentation

## 2021-09-02 LAB — CBC WITH DIFFERENTIAL (CANCER CENTER ONLY)
Abs Immature Granulocytes: 0.02 10*3/uL (ref 0.00–0.07)
Basophils Absolute: 0.1 10*3/uL (ref 0.0–0.1)
Basophils Relative: 1 %
Eosinophils Absolute: 0.1 10*3/uL (ref 0.0–0.5)
Eosinophils Relative: 2 %
HCT: 39.5 % (ref 36.0–46.0)
Hemoglobin: 13.3 g/dL (ref 12.0–15.0)
Immature Granulocytes: 0 %
Lymphocytes Relative: 33 %
Lymphs Abs: 1.9 10*3/uL (ref 0.7–4.0)
MCH: 30.4 pg (ref 26.0–34.0)
MCHC: 33.7 g/dL (ref 30.0–36.0)
MCV: 90.4 fL (ref 80.0–100.0)
Monocytes Absolute: 0.5 10*3/uL (ref 0.1–1.0)
Monocytes Relative: 8 %
Neutro Abs: 3.3 10*3/uL (ref 1.7–7.7)
Neutrophils Relative %: 56 %
Platelet Count: 260 10*3/uL (ref 150–400)
RBC: 4.37 MIL/uL (ref 3.87–5.11)
RDW: 11.9 % (ref 11.5–15.5)
WBC Count: 5.9 10*3/uL (ref 4.0–10.5)
nRBC: 0 % (ref 0.0–0.2)

## 2021-09-02 LAB — LACTATE DEHYDROGENASE: LDH: 147 U/L (ref 98–192)

## 2021-09-02 LAB — COMPREHENSIVE METABOLIC PANEL
ALT: 31 U/L (ref 0–44)
AST: 26 U/L (ref 15–41)
Albumin: 3.7 g/dL (ref 3.5–5.0)
Alkaline Phosphatase: 39 U/L (ref 38–126)
Anion gap: 5 (ref 5–15)
BUN: 14 mg/dL (ref 8–23)
CO2: 31 mmol/L (ref 22–32)
Calcium: 9.6 mg/dL (ref 8.9–10.3)
Chloride: 102 mmol/L (ref 98–111)
Creatinine, Ser: 0.69 mg/dL (ref 0.44–1.00)
GFR, Estimated: >60 mL/min (ref >60)
Glucose, Bld: 108 mg/dL — ABNORMAL HIGH (ref 70–99)
Potassium: 4.3 mmol/L (ref 3.5–5.1)
Sodium: 138 mmol/L (ref 135–145)
Total Bilirubin: 0.5 mg/dL (ref 0.3–1.2)
Total Protein: 6.2 g/dL — ABNORMAL LOW (ref 6.5–8.1)

## 2021-09-02 NOTE — Progress Notes (Signed)
?Hematology and Oncology Follow Up Visit ? ?Kristine Stewart ?[id number] ?May 29, 1956 66 y.o. ?09/02/2021 ? ? ?Principle Diagnosis:  ?Ductal carcinoma in situ of the left breast ? ?Current Therapy:   ?Observation ?   ?Interim History:  Ms. Point is here today for follow-up.  We see her yearly.  Is been almost 20 years now that she had the DCIS. ? ?I told her that I felt bad for her because she is a big [employer] and they did not when the [employer].  I will certainly cheering for them. ? ?Overall, she is doing quite well.  She is having some thyroid issues.  Her family doctor is trying to help manage this. ? ?She is exercising.  She and her husband will be going up to [place] for their anniversary this summer.  I am sure that they will have a wonderful time. ? ?In October, there will be going down to [facility] for Following Halloween Horror Night. ? ?She has had no problems with bowels or bladder.  She has had no issues with fever.  She has had no problems with COVID.  There is been no headache.  She had no cough or shortness of breath. ? ?Overall, I would say performance status is ECOG 0.     ? ? ?Medications:  ?Allergies as of 09/02/2021   ? ?   Reactions  ? Codeine Itching  ? ?  ? ?  ?Medication List  ?  ? ?  ? Accurate as of September 02, 2021  9:29 AM. If you have any questions, ask your nurse or doctor.  ?  ?  ? ?  ? ?STOP taking these medications   ? ?aspirin 81 MG EC tablet ?Stopped by: [name], MD ?  ? ?  ? ?TAKE these medications   ? ?Calcium 500 MG tablet ?Take 2 tablets by mouth 2 (two) times daily. ?  ?clobetasol 0.05 % external solution ?Commonly known as: TEMOVATE ?  ?diphenhydrAMINE 25 MG tablet ?Commonly known as: BENADRYL ?Take 25 mg by mouth at bedtime as needed. ?  ?levothyroxine 100 MCG tablet ?Commonly known as: SYNTHROID ?Take 100 mcg by mouth daily. ?  ?liothyronine 5 MCG tablet ?Commonly known as: CYTOMEL ?Take 12.5 mcg by mouth. 09/02/2021 ?Takes 10 mcg in am and 5 mcg evening. ?   ?Melatonin 10 MG Caps ?Take 10 mg by mouth at bedtime. ?  ?metoprolol succinate 100 MG 24 hr tablet ?Commonly known as: TOPROL-XL ?Take 100 mg by mouth daily. ?  ?multivitamin with minerals Tabs tablet ?Take 1 tablet by mouth daily. ?  ?simvastatin 40 MG tablet ?Commonly known as: ZOCOR ?Take 40 mg by mouth daily. ?  ?SUPER B COMPLEX/C PO ?Take 1 tablet by mouth daily. ?  ?vitamin C 1000 MG tablet ?Take 1,000 mg by mouth daily. ?  ?Vitamin D3 50 MCG (2000 UT) Tabs ?Take by mouth every morning. ?  ? ?  ? ? ?Allergies:  ?Allergies  ?Allergen Reactions  ? Codeine Itching  ? ? ?Past Medical History, Surgical history, Social history, and Family History were reviewed and updated. ? ?Review of Systems: ?Review of Systems  ?Constitutional: Negative.   ?HENT: Negative.    ?Eyes: Negative.   ?Respiratory: Negative.    ?Cardiovascular: Negative.   ?Gastrointestinal: Negative.   ?Genitourinary: Negative.   ?Musculoskeletal: Negative.   ?Skin: Negative.   ?Neurological: Negative.   ?Endo/Heme/Allergies: Negative.   ?Psychiatric/Behavioral: Negative.    ? ? ?Physical Exam: ? weight is 163  lb 1.3 oz (74 kg). Her oral temperature is 97.8 ?F (36.6 ?C). Her blood pressure is 123/69 and her pulse is 74. Her respiration is 20 and oxygen saturation is 100%.  ? ?Wt Readings from Last 3 Encounters:  ?09/02/21 163 lb 1.3 oz (74 kg)  ?08/31/20 161 lb (73 kg)  ?09/02/19 154 lb (69.9 kg)  ? ? ?Physical Exam ?Vitals reviewed.  ?HENT:  ?   Head: Normocephalic and atraumatic.  ?Eyes:  ?   Pupils: Pupils are equal, round, and reactive to light.  ?Cardiovascular:  ?   Rate and Rhythm: Normal rate and regular rhythm.  ?   Heart sounds: Normal heart sounds.  ?Pulmonary:  ?   Effort: Pulmonary effort is normal.  ?   Breath sounds: Normal breath sounds.  ?Abdominal:  ?   General: Bowel sounds are normal.  ?   Palpations: Abdomen is soft.  ?Musculoskeletal:     ?   General: No tenderness or deformity. Normal range of motion.  ?   Cervical back: Normal  range of motion.  ?Lymphadenopathy:  ?   Cervical: No cervical adenopathy.  ?Skin: ?   General: Skin is warm and dry.  ?   Findings: No erythema or rash.  ?Neurological:  ?   Mental Status: She is alert and oriented to person, place, and time.  ?Psychiatric:     ?   Behavior: Behavior normal.     ?   Thought Content: Thought content normal.     ?   Judgment: Judgment normal.  ? ? ? ?Lab Results  ?Component Value Date  ? WBC 5.9 09/02/2021  ? HGB 13.3 09/02/2021  ? HCT 39.5 09/02/2021  ? MCV 90.4 09/02/2021  ? PLT 260 09/02/2021  ? ?No results found for: FERRITIN, IRON, TIBC, UIBC, IRONPCTSAT ?Lab Results  ?Component Value Date  ? RBC 4.37 09/02/2021  ? ?No results found for: KPAFRELGTCHN, LAMBDASER, KAPLAMBRATIO ?No results found for: IGGSERUM, IGA, IGMSERUM ?No results found for: TOTALPROTELP, ALBUMINELP, A1GS, A2GS, BETS, BETA2SER, GAMS, MSPIKE, SPEI ?  Chemistry   ?   ?Component Value Date/Time  ? NA 138 09/02/2021 0852  ? NA 141 08/27/2016 0854  ? K 4.3 09/02/2021 0852  ? K 4.1 08/27/2016 0854  ? CL 102 09/02/2021 0852  ? CL 98 07/06/2015 1002  ? CO2 31 09/02/2021 0852  ? CO2 29 08/27/2016 0854  ? BUN 14 09/02/2021 0852  ? BUN 15.5 08/27/2016 0854  ? CREATININE 0.69 09/02/2021 0852  ? CREATININE 0.76 08/31/2020 0857  ? CREATININE 0.7 08/27/2016 0854  ?    ?Component Value Date/Time  ? CALCIUM 9.6 09/02/2021 0852  ? CALCIUM 9.7 08/27/2016 0854  ? ALKPHOS 39 09/02/2021 0852  ? ALKPHOS 59 08/27/2016 0854  ? AST 26 09/02/2021 0852  ? AST 20 08/31/2020 0857  ? AST 22 08/27/2016 0854  ? ALT 31 09/02/2021 0852  ? ALT 19 08/31/2020 0857  ? ALT 23 08/27/2016 0854  ? BILITOT 0.5 09/02/2021 0852  ? BILITOT 0.4 08/31/2020 0857  ? BILITOT 0.48 08/27/2016 0854  ?  ? ?Impression and Plan: [name] is 66yo white female with history of DCIS of the left breast over 18 years ago. ? ? She had a lumpectomy followed by radiation therapy. She continues to do well and so far there has been no evidence of recurrence.  ? ?Breast exam  today was negative and she continues to do self exams at home.  ? ?At this point time, I  think we can let her go from the clinic.  I just do not think we are adding much to her health care at this point.  I know she is being followed quite closely by her family doctor. ? ?If she has any problems down the road, we will be more than happy to get her back and see her. ? ? ? ?[name], MD ?3/13/20239:29 AM ? ?

## 2021-10-07 ENCOUNTER — Encounter (HOSPITAL_BASED_OUTPATIENT_CLINIC_OR_DEPARTMENT_OTHER): Payer: Self-pay

## 2021-10-07 ENCOUNTER — Ambulatory Visit (HOSPITAL_BASED_OUTPATIENT_CLINIC_OR_DEPARTMENT_OTHER)
Admission: RE | Admit: 2021-10-07 | Discharge: 2021-10-07 | Disposition: A | Discharge status: Home / Self Care | Payer: Medicare HMO | Source: Ambulatory Visit | Attending: Hematology & Oncology | Admitting: Hematology & Oncology

## 2021-10-07 DIAGNOSIS — Z1231 Encounter for screening mammogram for malignant neoplasm of breast: Secondary | ICD-10-CM | POA: Insufficient documentation

## 2021-11-27 DIAGNOSIS — H9319 Tinnitus, unspecified ear: Secondary | ICD-10-CM | POA: Diagnosis not present

## 2021-11-27 DIAGNOSIS — E039 Hypothyroidism, unspecified: Secondary | ICD-10-CM | POA: Diagnosis not present

## 2021-11-27 DIAGNOSIS — R42 Dizziness and giddiness: Secondary | ICD-10-CM | POA: Diagnosis not present

## 2021-11-27 DIAGNOSIS — R439 Unspecified disturbances of smell and taste: Secondary | ICD-10-CM | POA: Diagnosis not present

## 2021-11-29 DIAGNOSIS — E039 Hypothyroidism, unspecified: Secondary | ICD-10-CM | POA: Diagnosis not present

## 2022-01-01 DIAGNOSIS — H6123 Impacted cerumen, bilateral: Secondary | ICD-10-CM | POA: Diagnosis not present

## 2022-01-01 DIAGNOSIS — R2681 Unsteadiness on feet: Secondary | ICD-10-CM | POA: Diagnosis not present

## 2022-01-01 DIAGNOSIS — H5213 Myopia, bilateral: Secondary | ICD-10-CM | POA: Diagnosis not present

## 2022-01-01 DIAGNOSIS — H9313 Tinnitus, bilateral: Secondary | ICD-10-CM | POA: Diagnosis not present

## 2022-01-01 DIAGNOSIS — R439 Unspecified disturbances of smell and taste: Secondary | ICD-10-CM | POA: Diagnosis not present

## 2022-01-01 DIAGNOSIS — H524 Presbyopia: Secondary | ICD-10-CM | POA: Diagnosis not present

## 2022-01-01 DIAGNOSIS — H52223 Regular astigmatism, bilateral: Secondary | ICD-10-CM | POA: Diagnosis not present

## 2022-04-07 DIAGNOSIS — R7301 Impaired fasting glucose: Secondary | ICD-10-CM | POA: Diagnosis not present

## 2022-04-07 DIAGNOSIS — I10 Essential (primary) hypertension: Secondary | ICD-10-CM | POA: Diagnosis not present

## 2022-04-07 DIAGNOSIS — E782 Mixed hyperlipidemia: Secondary | ICD-10-CM | POA: Diagnosis not present

## 2022-04-07 DIAGNOSIS — E039 Hypothyroidism, unspecified: Secondary | ICD-10-CM | POA: Diagnosis not present

## 2022-04-11 DIAGNOSIS — Z0000 Encounter for general adult medical examination without abnormal findings: Secondary | ICD-10-CM | POA: Diagnosis not present

## 2022-04-11 DIAGNOSIS — R7303 Prediabetes: Secondary | ICD-10-CM | POA: Diagnosis not present

## 2022-04-11 DIAGNOSIS — I479 Paroxysmal tachycardia, unspecified: Secondary | ICD-10-CM | POA: Diagnosis not present

## 2022-04-11 DIAGNOSIS — Z6826 Body mass index (BMI) 26.0-26.9, adult: Secondary | ICD-10-CM | POA: Diagnosis not present

## 2022-04-11 DIAGNOSIS — R238 Other skin changes: Secondary | ICD-10-CM | POA: Diagnosis not present

## 2022-04-11 DIAGNOSIS — Z79899 Other long term (current) drug therapy: Secondary | ICD-10-CM | POA: Diagnosis not present

## 2022-04-11 DIAGNOSIS — E039 Hypothyroidism, unspecified: Secondary | ICD-10-CM | POA: Diagnosis not present

## 2022-04-11 DIAGNOSIS — E782 Mixed hyperlipidemia: Secondary | ICD-10-CM | POA: Diagnosis not present

## 2022-04-23 DIAGNOSIS — E663 Overweight: Secondary | ICD-10-CM | POA: Diagnosis not present

## 2022-04-23 DIAGNOSIS — L408 Other psoriasis: Secondary | ICD-10-CM | POA: Diagnosis not present

## 2022-05-07 DIAGNOSIS — D225 Melanocytic nevi of trunk: Secondary | ICD-10-CM | POA: Diagnosis not present

## 2022-05-07 DIAGNOSIS — E663 Overweight: Secondary | ICD-10-CM | POA: Diagnosis not present

## 2022-05-07 DIAGNOSIS — L821 Other seborrheic keratosis: Secondary | ICD-10-CM | POA: Diagnosis not present

## 2022-05-07 DIAGNOSIS — L814 Other melanin hyperpigmentation: Secondary | ICD-10-CM | POA: Diagnosis not present

## 2022-05-08 DIAGNOSIS — Z1211 Encounter for screening for malignant neoplasm of colon: Secondary | ICD-10-CM | POA: Diagnosis not present

## 2022-05-08 DIAGNOSIS — Z1212 Encounter for screening for malignant neoplasm of rectum: Secondary | ICD-10-CM | POA: Diagnosis not present

## 2022-05-10 IMAGING — MG MM DIGITAL SCREENING BILAT W/ TOMO AND CAD
8 series · 8 of 24 positions shown · non-contrast
Comparison: Previous exam(s).

CLINICAL DATA: Screening.

EXAM:
DIGITAL SCREENING BILATERAL MAMMOGRAM WITH TOMOSYNTHESIS AND CAD
TECHNIQUE: Bilateral screening digital craniocaudal and mediolateral oblique
mammograms were obtained. Bilateral screening digital breast
tomosynthesis was performed. The images were evaluated with
computer-aided detection.

[R CC synth-2D]
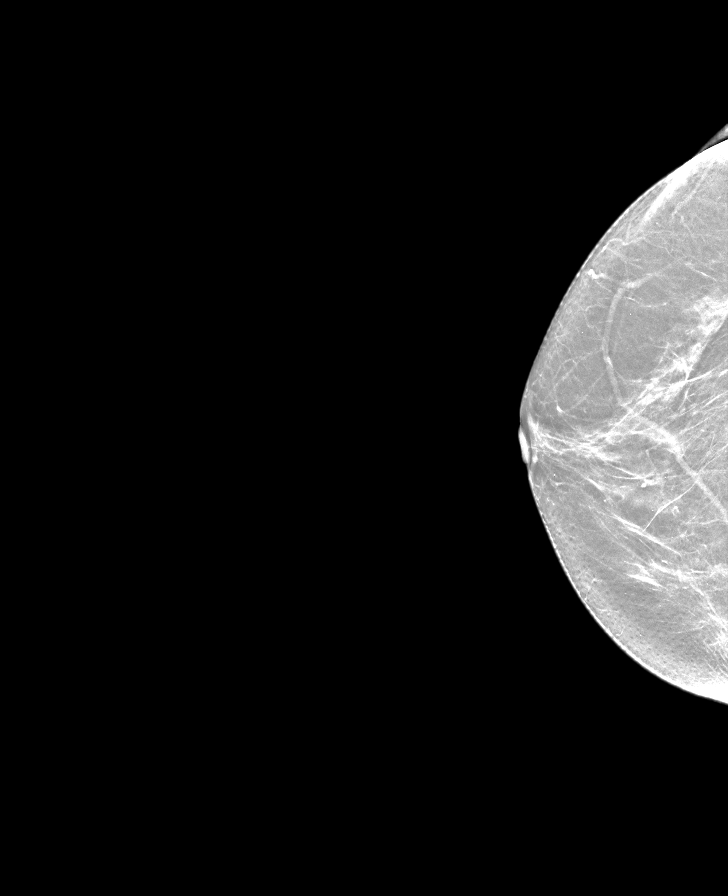

[L CC synth-2D]
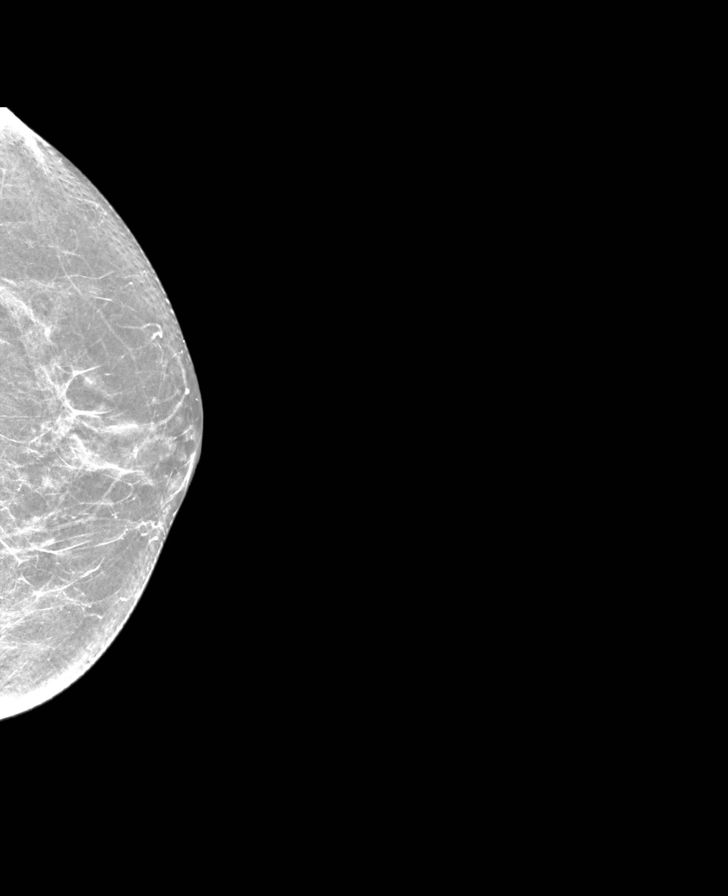

[R MLO synth-2D]
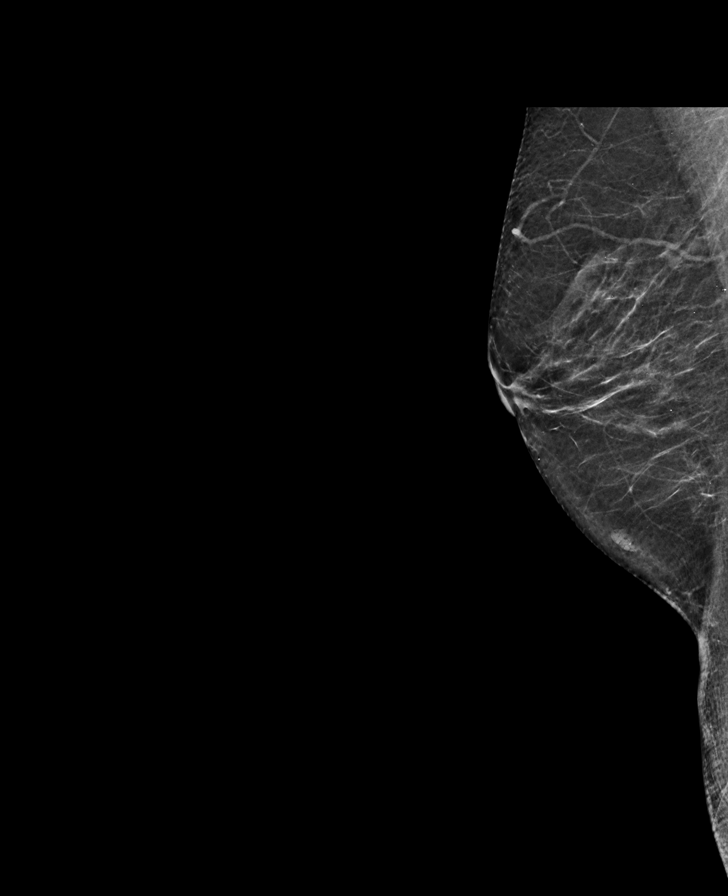

[L MLO synth-2D]
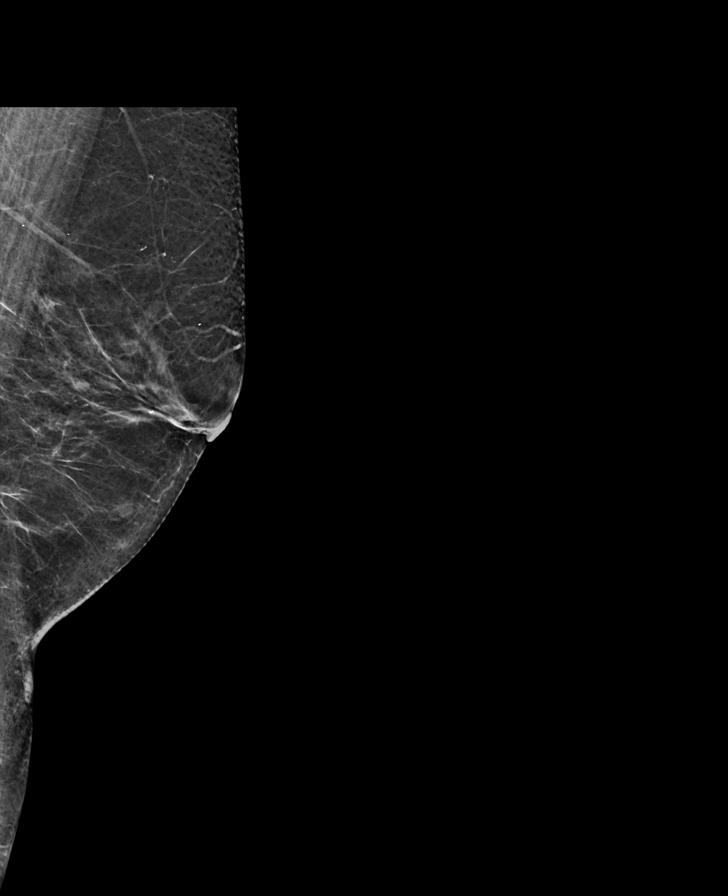

[R CC tomo · tomo slice 29/56.0]
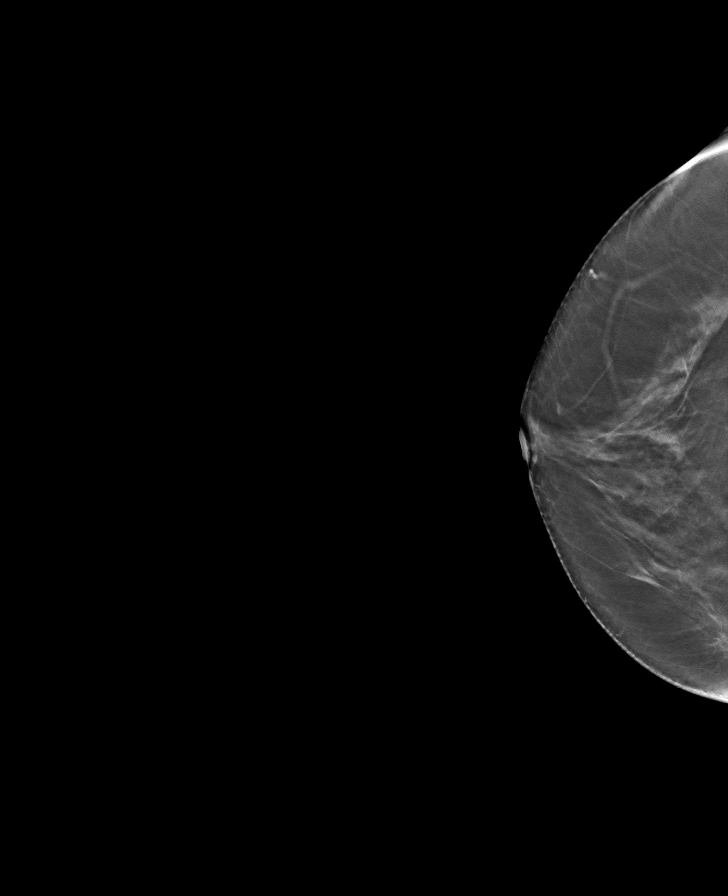

[R MLO tomo · tomo slice 27/52.0]
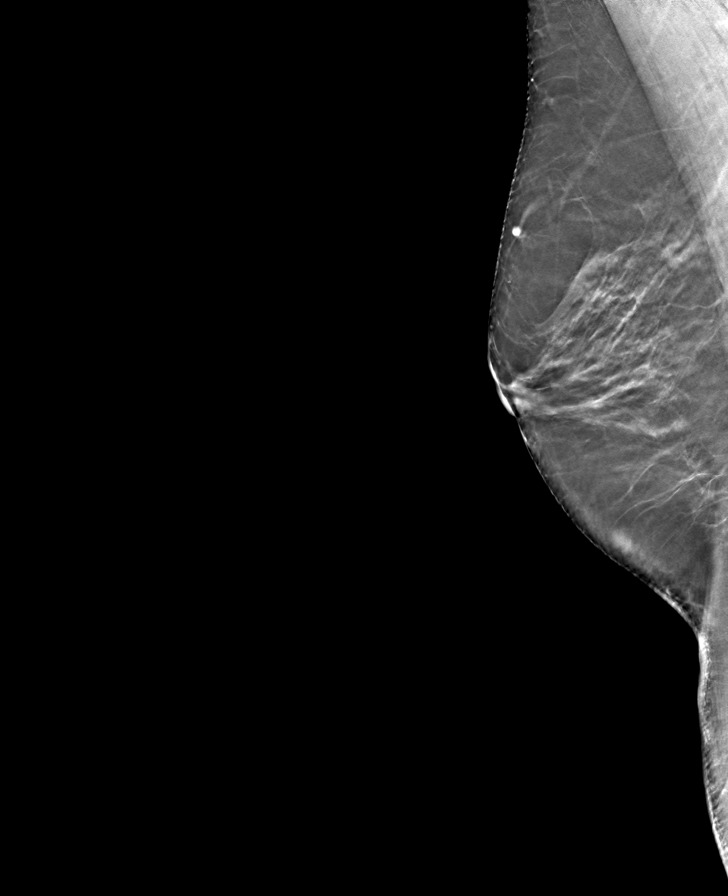

[L CC tomo · tomo slice 27/54.0]
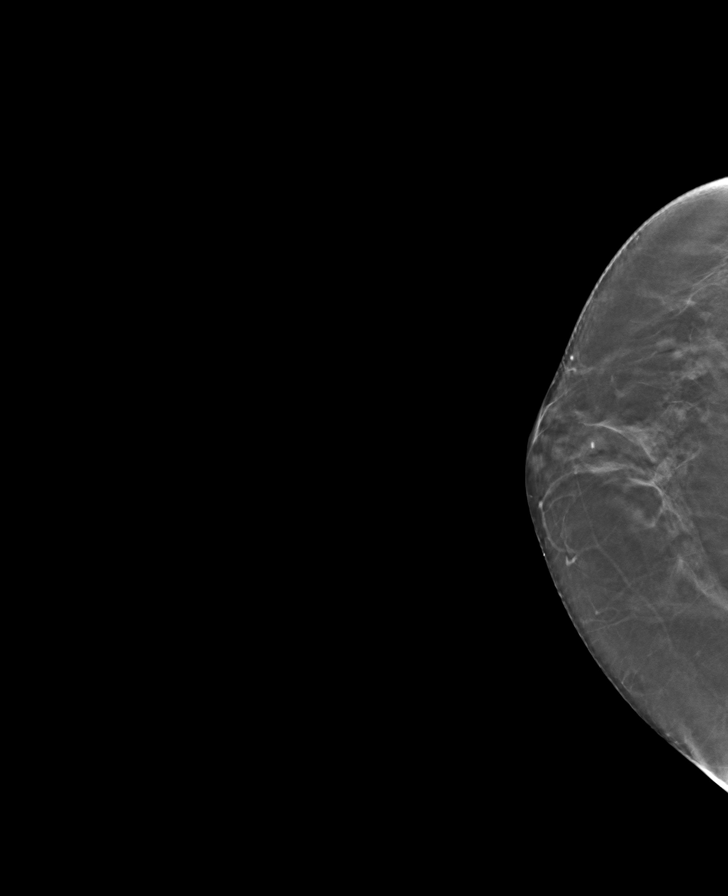

[L MLO tomo · tomo slice 27/52.0]
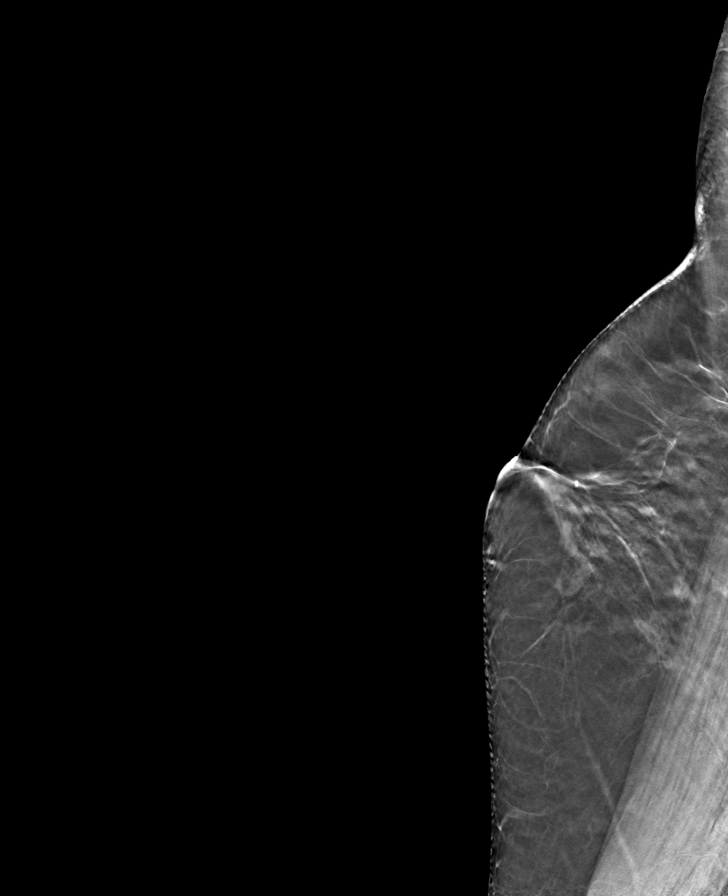

[8 of 24 positions shown; findings below may reference images not displayed]

ACR Breast Density Category b: There are scattered areas of
fibroglandular density.
FINDINGS: There are no findings suspicious for malignancy.
IMPRESSION: No mammographic evidence of malignancy. A result letter of this
screening mammogram will be mailed directly to the patient.

RECOMMENDATION:
Screening mammogram in one year. (Code:51-O-LD2)

BI-RADS CATEGORY  1: Negative.

## 2022-08-11 DIAGNOSIS — D485 Neoplasm of uncertain behavior of skin: Secondary | ICD-10-CM | POA: Diagnosis not present

## 2022-08-11 DIAGNOSIS — L820 Inflamed seborrheic keratosis: Secondary | ICD-10-CM | POA: Diagnosis not present

## 2022-08-15 DIAGNOSIS — S8001XA Contusion of right knee, initial encounter: Secondary | ICD-10-CM | POA: Diagnosis not present

## 2022-08-15 DIAGNOSIS — M25511 Pain in right shoulder: Secondary | ICD-10-CM | POA: Diagnosis not present

## 2022-08-15 DIAGNOSIS — Z6828 Body mass index (BMI) 28.0-28.9, adult: Secondary | ICD-10-CM | POA: Diagnosis not present

## 2022-08-15 DIAGNOSIS — S63502A Unspecified sprain of left wrist, initial encounter: Secondary | ICD-10-CM | POA: Diagnosis not present

## 2022-08-28 DIAGNOSIS — M25532 Pain in left wrist: Secondary | ICD-10-CM | POA: Diagnosis not present

## 2022-08-28 DIAGNOSIS — M25561 Pain in right knee: Secondary | ICD-10-CM | POA: Diagnosis not present

## 2022-08-28 DIAGNOSIS — M25511 Pain in right shoulder: Secondary | ICD-10-CM | POA: Diagnosis not present

## 2022-09-22 ENCOUNTER — Other Ambulatory Visit (HOSPITAL_BASED_OUTPATIENT_CLINIC_OR_DEPARTMENT_OTHER): Payer: Self-pay | Admitting: Family Medicine

## 2022-09-22 DIAGNOSIS — Z1231 Encounter for screening mammogram for malignant neoplasm of breast: Secondary | ICD-10-CM

## 2022-10-13 ENCOUNTER — Ambulatory Visit (HOSPITAL_BASED_OUTPATIENT_CLINIC_OR_DEPARTMENT_OTHER)
Admission: RE | Admit: 2022-10-13 | Discharge: 2022-10-13 | Disposition: A | Discharge status: Home / Self Care | Payer: Medicare HMO | Source: Ambulatory Visit | Attending: Family Medicine | Admitting: Family Medicine

## 2022-10-13 ENCOUNTER — Encounter (HOSPITAL_BASED_OUTPATIENT_CLINIC_OR_DEPARTMENT_OTHER): Payer: Self-pay

## 2022-10-13 ENCOUNTER — Ambulatory Visit (HOSPITAL_BASED_OUTPATIENT_CLINIC_OR_DEPARTMENT_OTHER): Payer: Self-pay

## 2022-10-13 DIAGNOSIS — Z1231 Encounter for screening mammogram for malignant neoplasm of breast: Secondary | ICD-10-CM | POA: Insufficient documentation

## 2022-12-16 DIAGNOSIS — D044 Carcinoma in situ of skin of scalp and neck: Secondary | ICD-10-CM | POA: Diagnosis not present

## 2022-12-16 DIAGNOSIS — R233 Spontaneous ecchymoses: Secondary | ICD-10-CM | POA: Diagnosis not present

## 2022-12-16 DIAGNOSIS — L820 Inflamed seborrheic keratosis: Secondary | ICD-10-CM | POA: Diagnosis not present

## 2022-12-16 DIAGNOSIS — D485 Neoplasm of uncertain behavior of skin: Secondary | ICD-10-CM | POA: Diagnosis not present

## 2023-01-08 DIAGNOSIS — L570 Actinic keratosis: Secondary | ICD-10-CM | POA: Diagnosis not present

## 2023-01-08 DIAGNOSIS — D1801 Hemangioma of skin and subcutaneous tissue: Secondary | ICD-10-CM | POA: Diagnosis not present

## 2023-01-08 DIAGNOSIS — L738 Other specified follicular disorders: Secondary | ICD-10-CM | POA: Diagnosis not present

## 2023-01-08 DIAGNOSIS — D485 Neoplasm of uncertain behavior of skin: Secondary | ICD-10-CM | POA: Diagnosis not present

## 2023-01-08 DIAGNOSIS — L821 Other seborrheic keratosis: Secondary | ICD-10-CM | POA: Diagnosis not present

## 2023-01-12 DIAGNOSIS — M79674 Pain in right toe(s): Secondary | ICD-10-CM | POA: Diagnosis not present

## 2023-01-12 DIAGNOSIS — I479 Paroxysmal tachycardia, unspecified: Secondary | ICD-10-CM | POA: Diagnosis not present

## 2023-01-20 DIAGNOSIS — M79671 Pain in right foot: Secondary | ICD-10-CM | POA: Diagnosis not present

## 2023-01-20 DIAGNOSIS — M2021 Hallux rigidus, right foot: Secondary | ICD-10-CM | POA: Diagnosis not present

## 2023-01-27 DIAGNOSIS — D044 Carcinoma in situ of skin of scalp and neck: Secondary | ICD-10-CM | POA: Diagnosis not present

## 2023-02-03 DIAGNOSIS — L905 Scar conditions and fibrosis of skin: Secondary | ICD-10-CM | POA: Diagnosis not present

## 2023-02-17 DIAGNOSIS — M79671 Pain in right foot: Secondary | ICD-10-CM | POA: Diagnosis not present

## 2023-02-17 DIAGNOSIS — M2021 Hallux rigidus, right foot: Secondary | ICD-10-CM | POA: Diagnosis not present

## 2023-03-25 DIAGNOSIS — M2021 Hallux rigidus, right foot: Secondary | ICD-10-CM | POA: Diagnosis not present

## 2023-03-25 DIAGNOSIS — M79671 Pain in right foot: Secondary | ICD-10-CM | POA: Diagnosis not present

## 2023-04-15 DIAGNOSIS — R7303 Prediabetes: Secondary | ICD-10-CM | POA: Diagnosis not present

## 2023-04-15 DIAGNOSIS — E782 Mixed hyperlipidemia: Secondary | ICD-10-CM | POA: Diagnosis not present

## 2023-04-15 DIAGNOSIS — E039 Hypothyroidism, unspecified: Secondary | ICD-10-CM | POA: Diagnosis not present

## 2023-04-15 DIAGNOSIS — Z79899 Other long term (current) drug therapy: Secondary | ICD-10-CM | POA: Diagnosis not present

## 2023-04-20 DIAGNOSIS — Z79899 Other long term (current) drug therapy: Secondary | ICD-10-CM | POA: Diagnosis not present

## 2023-04-20 DIAGNOSIS — Z0000 Encounter for general adult medical examination without abnormal findings: Secondary | ICD-10-CM | POA: Diagnosis not present

## 2023-04-20 DIAGNOSIS — E039 Hypothyroidism, unspecified: Secondary | ICD-10-CM | POA: Diagnosis not present

## 2023-04-20 DIAGNOSIS — Z6828 Body mass index (BMI) 28.0-28.9, adult: Secondary | ICD-10-CM | POA: Diagnosis not present

## 2023-04-20 DIAGNOSIS — T753XXA Motion sickness, initial encounter: Secondary | ICD-10-CM | POA: Diagnosis not present

## 2023-04-20 DIAGNOSIS — R7303 Prediabetes: Secondary | ICD-10-CM | POA: Diagnosis not present

## 2023-04-20 DIAGNOSIS — I471 Supraventricular tachycardia, unspecified: Secondary | ICD-10-CM | POA: Diagnosis not present

## 2023-04-20 DIAGNOSIS — E782 Mixed hyperlipidemia: Secondary | ICD-10-CM | POA: Diagnosis not present

## 2023-07-13 DIAGNOSIS — D485 Neoplasm of uncertain behavior of skin: Secondary | ICD-10-CM | POA: Diagnosis not present

## 2023-07-13 DIAGNOSIS — L570 Actinic keratosis: Secondary | ICD-10-CM | POA: Diagnosis not present

## 2023-07-13 DIAGNOSIS — D1801 Hemangioma of skin and subcutaneous tissue: Secondary | ICD-10-CM | POA: Diagnosis not present

## 2023-07-13 DIAGNOSIS — Z86008 Personal history of in-situ neoplasm of other site: Secondary | ICD-10-CM | POA: Diagnosis not present

## 2023-07-13 DIAGNOSIS — L821 Other seborrheic keratosis: Secondary | ICD-10-CM | POA: Diagnosis not present

## 2023-08-05 DIAGNOSIS — M2021 Hallux rigidus, right foot: Secondary | ICD-10-CM | POA: Diagnosis not present

## 2023-08-05 DIAGNOSIS — M79671 Pain in right foot: Secondary | ICD-10-CM | POA: Diagnosis not present

## 2023-09-14 ENCOUNTER — Other Ambulatory Visit (HOSPITAL_BASED_OUTPATIENT_CLINIC_OR_DEPARTMENT_OTHER): Payer: Self-pay | Admitting: Family Medicine

## 2023-09-14 DIAGNOSIS — Z1231 Encounter for screening mammogram for malignant neoplasm of breast: Secondary | ICD-10-CM

## 2023-10-19 ENCOUNTER — Encounter (HOSPITAL_BASED_OUTPATIENT_CLINIC_OR_DEPARTMENT_OTHER): Payer: Self-pay

## 2023-10-19 ENCOUNTER — Ambulatory Visit (HOSPITAL_BASED_OUTPATIENT_CLINIC_OR_DEPARTMENT_OTHER)
Admission: RE | Admit: 2023-10-19 | Discharge: 2023-10-19 | Disposition: A | Discharge status: Home / Self Care | Source: Ambulatory Visit | Attending: Family Medicine | Admitting: Family Medicine

## 2023-10-19 DIAGNOSIS — Z1231 Encounter for screening mammogram for malignant neoplasm of breast: Secondary | ICD-10-CM | POA: Diagnosis not present

## 2023-10-29 DIAGNOSIS — G8918 Other acute postprocedural pain: Secondary | ICD-10-CM | POA: Diagnosis not present

## 2023-10-29 DIAGNOSIS — M205X1 Other deformities of toe(s) (acquired), right foot: Secondary | ICD-10-CM | POA: Diagnosis not present

## 2023-10-29 DIAGNOSIS — M2021 Hallux rigidus, right foot: Secondary | ICD-10-CM | POA: Diagnosis not present

## 2023-11-04 DIAGNOSIS — M2021 Hallux rigidus, right foot: Secondary | ICD-10-CM | POA: Diagnosis not present

## 2023-11-11 DIAGNOSIS — M2021 Hallux rigidus, right foot: Secondary | ICD-10-CM | POA: Diagnosis not present

## 2023-11-26 DIAGNOSIS — M2021 Hallux rigidus, right foot: Secondary | ICD-10-CM | POA: Diagnosis not present

## 2023-12-24 DIAGNOSIS — M2021 Hallux rigidus, right foot: Secondary | ICD-10-CM | POA: Diagnosis not present

## 2024-01-11 DIAGNOSIS — L821 Other seborrheic keratosis: Secondary | ICD-10-CM | POA: Diagnosis not present

## 2024-01-11 DIAGNOSIS — D225 Melanocytic nevi of trunk: Secondary | ICD-10-CM | POA: Diagnosis not present

## 2024-01-11 DIAGNOSIS — L738 Other specified follicular disorders: Secondary | ICD-10-CM | POA: Diagnosis not present

## 2024-01-11 DIAGNOSIS — D485 Neoplasm of uncertain behavior of skin: Secondary | ICD-10-CM | POA: Diagnosis not present

## 2024-01-11 DIAGNOSIS — L814 Other melanin hyperpigmentation: Secondary | ICD-10-CM | POA: Diagnosis not present

## 2024-01-11 DIAGNOSIS — D1801 Hemangioma of skin and subcutaneous tissue: Secondary | ICD-10-CM | POA: Diagnosis not present

## 2024-01-11 DIAGNOSIS — Z08 Encounter for follow-up examination after completed treatment for malignant neoplasm: Secondary | ICD-10-CM | POA: Diagnosis not present

## 2024-01-11 DIAGNOSIS — L408 Other psoriasis: Secondary | ICD-10-CM | POA: Diagnosis not present

## 2024-01-11 DIAGNOSIS — Z86008 Personal history of in-situ neoplasm of other site: Secondary | ICD-10-CM | POA: Diagnosis not present

## 2024-04-20 DIAGNOSIS — E782 Mixed hyperlipidemia: Secondary | ICD-10-CM | POA: Diagnosis not present

## 2024-04-20 DIAGNOSIS — Z79899 Other long term (current) drug therapy: Secondary | ICD-10-CM | POA: Diagnosis not present

## 2024-04-20 DIAGNOSIS — R7303 Prediabetes: Secondary | ICD-10-CM | POA: Diagnosis not present

## 2024-04-20 DIAGNOSIS — E039 Hypothyroidism, unspecified: Secondary | ICD-10-CM | POA: Diagnosis not present

## 2024-04-27 DIAGNOSIS — L400 Psoriasis vulgaris: Secondary | ICD-10-CM | POA: Diagnosis not present

## 2024-04-27 DIAGNOSIS — E782 Mixed hyperlipidemia: Secondary | ICD-10-CM | POA: Diagnosis not present

## 2024-04-27 DIAGNOSIS — R7303 Prediabetes: Secondary | ICD-10-CM | POA: Diagnosis not present

## 2024-04-27 DIAGNOSIS — I479 Paroxysmal tachycardia, unspecified: Secondary | ICD-10-CM | POA: Diagnosis not present

## 2024-04-27 DIAGNOSIS — Z0000 Encounter for general adult medical examination without abnormal findings: Secondary | ICD-10-CM | POA: Diagnosis not present

## 2024-04-27 DIAGNOSIS — E039 Hypothyroidism, unspecified: Secondary | ICD-10-CM | POA: Diagnosis not present

## 2024-04-27 DIAGNOSIS — K9189 Other postprocedural complications and disorders of digestive system: Secondary | ICD-10-CM | POA: Diagnosis not present

## 2024-04-27 DIAGNOSIS — Z6826 Body mass index (BMI) 26.0-26.9, adult: Secondary | ICD-10-CM | POA: Diagnosis not present
